# Patient Record
Sex: Female | Born: 1965
Health system: Southern US, Community
[De-identification: ages and names within clinical notes are randomized; demographics above are authoritative.]

## PROBLEM LIST (undated history)

## (undated) DIAGNOSIS — F329 Major depressive disorder, single episode, unspecified: Secondary | ICD-10-CM

## (undated) DIAGNOSIS — I10 Essential (primary) hypertension: Secondary | ICD-10-CM

## (undated) DIAGNOSIS — M069 Rheumatoid arthritis, unspecified: Secondary | ICD-10-CM

## (undated) DIAGNOSIS — K529 Noninfective gastroenteritis and colitis, unspecified: Secondary | ICD-10-CM

## (undated) DIAGNOSIS — M1990 Unspecified osteoarthritis, unspecified site: Secondary | ICD-10-CM

## (undated) DIAGNOSIS — F32A Depression, unspecified: Secondary | ICD-10-CM

## (undated) DIAGNOSIS — D649 Anemia, unspecified: Secondary | ICD-10-CM

## (undated) DIAGNOSIS — N390 Urinary tract infection, site not specified: Secondary | ICD-10-CM

## (undated) DIAGNOSIS — K2970 Gastritis, unspecified, without bleeding: Secondary | ICD-10-CM

## (undated) DIAGNOSIS — D351 Benign neoplasm of parathyroid gland: Secondary | ICD-10-CM

## (undated) DIAGNOSIS — N189 Chronic kidney disease, unspecified: Secondary | ICD-10-CM

## (undated) HISTORY — DX: Urinary tract infection, site not specified: N39.0

## (undated) HISTORY — DX: Major depressive disorder, single episode, unspecified: F32.9

## (undated) HISTORY — DX: Unspecified osteoarthritis, unspecified site: M19.90

## (undated) HISTORY — DX: Gastritis, unspecified, without bleeding: K29.70

## (undated) HISTORY — DX: Noninfective gastroenteritis and colitis, unspecified: K52.9

## (undated) HISTORY — PX: FOOT SURGERY: SHX648

## (undated) HISTORY — DX: Depression, unspecified: F32.A

## (undated) HISTORY — DX: Essential (primary) hypertension: I10

---
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
## 1998-02-22 ENCOUNTER — Other Ambulatory Visit: Admission: RE | Admit: 1998-02-22 | Discharge: 1998-02-22 | Payer: Self-pay | Admitting: *Deleted

## 1998-07-18 ENCOUNTER — Other Ambulatory Visit: Admission: RE | Admit: 1998-07-18 | Discharge: 1998-07-18 | Payer: Self-pay | Admitting: *Deleted

## 2000-01-16 ENCOUNTER — Other Ambulatory Visit: Admission: RE | Admit: 2000-01-16 | Discharge: 2000-01-16 | Payer: Self-pay | Admitting: *Deleted

## 2001-02-05 ENCOUNTER — Other Ambulatory Visit: Admission: RE | Admit: 2001-02-05 | Discharge: 2001-02-05 | Payer: Self-pay | Admitting: *Deleted

## 2002-02-17 ENCOUNTER — Other Ambulatory Visit: Admission: RE | Admit: 2002-02-17 | Discharge: 2002-02-17 | Payer: Self-pay | Admitting: *Deleted

## 2003-02-21 ENCOUNTER — Other Ambulatory Visit: Admission: RE | Admit: 2003-02-21 | Discharge: 2003-02-21 | Payer: Self-pay | Admitting: *Deleted

## 2006-10-07 ENCOUNTER — Ambulatory Visit: Payer: Self-pay | Admitting: *Deleted

## 2007-01-15 ENCOUNTER — Ambulatory Visit: Payer: Self-pay | Admitting: Internal Medicine

## 2007-01-16 ENCOUNTER — Ambulatory Visit: Payer: Self-pay | Admitting: Internal Medicine

## 2008-03-21 ENCOUNTER — Ambulatory Visit: Payer: Self-pay | Admitting: *Deleted

## 2009-04-03 ENCOUNTER — Ambulatory Visit: Payer: Self-pay

## 2009-04-10 ENCOUNTER — Ambulatory Visit: Payer: Self-pay

## 2010-06-19 ENCOUNTER — Ambulatory Visit: Payer: Self-pay | Admitting: Obstetrics

## 2011-01-09 ENCOUNTER — Ambulatory Visit: Payer: Self-pay | Admitting: Family Medicine

## 2011-06-16 ENCOUNTER — Ambulatory Visit: Payer: Self-pay | Admitting: Gastroenterology

## 2011-08-05 ENCOUNTER — Ambulatory Visit: Payer: Self-pay | Admitting: Obstetrics

## 2011-08-07 LAB — HM MAMMOGRAPHY

## 2012-01-09 ENCOUNTER — Other Ambulatory Visit: Payer: Self-pay | Admitting: Family Medicine

## 2012-09-06 LAB — HM PAP SMEAR: HM Pap smear: NORMAL

## 2012-10-26 ENCOUNTER — Ambulatory Visit: Payer: Self-pay | Admitting: Obstetrics

## 2012-12-23 ENCOUNTER — Encounter: Payer: Self-pay | Admitting: Internal Medicine

## 2012-12-23 ENCOUNTER — Ambulatory Visit (INDEPENDENT_AMBULATORY_CARE_PROVIDER_SITE_OTHER): Payer: BC Managed Care – PPO | Admitting: Internal Medicine

## 2012-12-23 VITALS — BP 122/78 | HR 86 | Temp 98.1°F | Resp 16 | Ht 61.5 in | Wt 149.5 lb

## 2012-12-23 DIAGNOSIS — R5383 Other fatigue: Secondary | ICD-10-CM

## 2012-12-23 DIAGNOSIS — M069 Rheumatoid arthritis, unspecified: Secondary | ICD-10-CM

## 2012-12-23 DIAGNOSIS — F329 Major depressive disorder, single episode, unspecified: Secondary | ICD-10-CM

## 2012-12-23 DIAGNOSIS — Z23 Encounter for immunization: Secondary | ICD-10-CM

## 2012-12-23 DIAGNOSIS — N921 Excessive and frequent menstruation with irregular cycle: Secondary | ICD-10-CM

## 2012-12-23 DIAGNOSIS — F32A Depression, unspecified: Secondary | ICD-10-CM

## 2012-12-23 DIAGNOSIS — Z13220 Encounter for screening for lipoid disorders: Secondary | ICD-10-CM

## 2012-12-23 DIAGNOSIS — R5381 Other malaise: Secondary | ICD-10-CM

## 2012-12-23 DIAGNOSIS — N920 Excessive and frequent menstruation with regular cycle: Secondary | ICD-10-CM

## 2012-12-23 LAB — TSH: TSH: 1.4 u[IU]/mL (ref 0.35–5.50)

## 2012-12-23 LAB — CBC WITH DIFFERENTIAL/PLATELET
Basophils Absolute: 0 10*3/uL (ref 0.0–0.1)
Eosinophils Relative: 2.5 % (ref 0.0–5.0)
Hemoglobin: 14.4 g/dL (ref 12.0–15.0)
Lymphocytes Relative: 21.3 % (ref 12.0–46.0)
Monocytes Relative: 6 % (ref 3.0–12.0)
Neutro Abs: 6.4 10*3/uL (ref 1.4–7.7)
Platelets: 342 10*3/uL (ref 150.0–400.0)
RDW: 13.8 % (ref 11.5–14.6)
WBC: 9.1 10*3/uL (ref 4.5–10.5)

## 2012-12-23 LAB — COMPREHENSIVE METABOLIC PANEL
ALT: 30 U/L (ref 0–35)
CO2: 25 mEq/L (ref 19–32)
Calcium: 9.9 mg/dL (ref 8.4–10.5)
Chloride: 104 mEq/L (ref 96–112)
Creatinine, Ser: 0.7 mg/dL (ref 0.4–1.2)
GFR: 92.34 mL/min (ref >60.00)
Glucose, Bld: 93 mg/dL (ref 70–99)

## 2012-12-23 LAB — LIPID PANEL
Cholesterol: 191 mg/dL (ref 0–200)
HDL: 58.9 mg/dL (ref >39.00)
Total CHOL/HDL Ratio: 3
Triglycerides: 38 mg/dL (ref 0.0–149.0)

## 2012-12-23 NOTE — Progress Notes (Signed)
Patient ID: Sherry Maxwell, female   DOB: 05-08-1966, 47 y.o.   MRN: [id number]  Patient Active Problem List  Diagnosis  . Rheumatoid arthritis  . Menometrorrhagia  . Depression    Subjective:  CC:   Chief Complaint  Patient presents with  . Establish Care    HPI:   Sherry Maxwell is a 47 y.o. female who presents as a new patient to establish primary care with several concerns:   1)Menometroraghia.   Her heavy bleeding has been accompanied by menopausal symptoms but odl shrecent serologic evaluation of hormone levels by her gynecologist indicated no evidence of perimenopausal status. Her periods have been irregular and she alternates between spotting followed by no periods for several months followed by bleeding at up to 17 days in a room with heavy flow noted. She has had a urine pregnancy test which was negative. She was told to take ibuprofen which did stop the bleeding. She has been started on Loestrin but stopped it after one month when her bleeding continued. She is wary of taking hormone therapy since her family history is positive for breast cancer  in her mother. She has opted to try managing her menopausal syndrome with natural remedies.   2) Moodiness.   Her symptoms are aggravated by increased responsibilities at home sharing the burden of care for her father-in-law who lives with them and is battling advanced lung cancer complicated by COPD and coronary artery disease.  this is her  2nd marriage, and she has been married to her husband for 7 years .  2 children from first marriage. Daughter is 77 and getting married in May.  Very happy about that.   Was a single parent for 14 years.  Son is a Arts administrator at [employer] and wants to go to PT school.   both her father and her father-in-law are patients of mine. Her father is Mickie Bail and her father-in-law is  Azelea Seguin.  She has very good self-awareness skills and recognizes that part of her irritabilityis due to resentment  and jealousy  over the time her husband spends with her father-in-law. She is managing her symptoms with massage therapy and psychotherapy.  She discontinued  zoloft 8 months ago, but continues to  have occasional panic attacks.,  Uses prn alprazolam.  All in all she feels much better than 3 months ago.  3) Weight gain.  In the past 7 year since remarrying (to [place])), she gained  45 lbs.  However she has thus far lost 16 lbs in the past 2 months and has been using Herbal Life for the past month .   does not exercise regularly. 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 Reviewed her schedule. ;  Gets up at 7 am   Works at [employer] in [place]  Arrives at [employer].  manages a Fish farm manager .  Takes a 1 hr lunch break. Off at 5:30  ,  Home by 6:15.  Prepares dinner,  Does not take work home.  In bed by 11:00 .     Past Medical History  Diagnosis Date  . Urinary tract infection   . rheumatoid arthritis   . Hypertension   . Depression     Past Surgical History  Procedure Laterality Date  . 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 Foot surgery      1996 and 2011    Family History  Problem Relation Age of Onset  . Arthritis Mother   .  Cancer Mother     breast  . Hypertension Mother   . Thyroid disease Mother     Graves Disease  . Hypertension Father   . Diabetes Father     pancreatitis induced  . Thyroid disease Sister     Graves Disease  . Thyroid disease Brother     Graves Disease  . Hypertension Maternal Grandmother   . Arthritis Paternal Grandmother     History   Social History  . Marital Status: Married    Spouse Name: N/A    Number of Children: N/A  . Years of Education: N/A   Occupational History  . Not on file.   Social History Main Topics  . Smoking status: Never Smoker   . Smokeless tobacco: Never Used  . Alcohol Use: Yes  . Drug Use: No  . Sexually Active: Yes   Other Topics Concern  . Not on file   Social History Narrative  . No narrative on file   Allergies   Allergen Reactions  . Sulfa Antibiotics Swelling and Rash     Review of Systems:   The remainder of the review of systems was negative except those addressed in the HPI.   Objective:  BP 122/78  Pulse 86  Temp(Src) 98.1 F (36.7 C) (Oral)  Resp 16  Ht 5' 1.5" (1.562 m)  Wt 149 lb 8 oz (67.813 kg)  BMI 27.79 kg/m2  SpO2 99%  LMP 12/11/2012  General appearance: alert, cooperative and appears stated age Ears: normal TM's and external ear canals both ears Throat: lips, mucosa, and tongue normal; teeth and gums normal Neck: no adenopathy, no carotid bruit, supple, symmetrical, trachea midline and thyroid not enlarged, symmetric, no tenderness/mass/nodules Back: symmetric, no curvature. ROM normal. No CVA tenderness. Lungs: clear to auscultation bilaterally Heart: regular rate and rhythm, S1, S2 normal, no murmur, click, rub or gallop Abdomen: soft, non-tender; bowel sounds normal; no masses,  no organomegaly Pulses: 2+ and symmetric Skin: Skin color, texture, turgor normal. No rashes or lesions Lymph nodes: Cervical, supraclavicular, and axillary nodes normal.  Assessment and Plan:  Rheumatoid arthritis Diagnosed by Dr. Jodie Echevaria at age 89.  She has no joint deformities.. She has been prescribed Enbrel but has been using it intermittently and her symptoms are currently quiescent. She has not had a flu shot this year or have and the Pneumovax vaccine. Both of these were given to her today.  Depression Managed nonpharmacologically currently with counselling , per patient preference.  With concurrent anxiety,  Recommended regular exercise  To relieve stress and manage her unwelcome  weight gain.   Menometrorrhagia She is under treatment currently by her gynecology .  No ultrasound has been done.   She has no history of epistaxis or prior history of bleeding diathesis.  CBC was normal today .  Will defer to GYN but given her desire to avoid HRT, may need to consider surgical  interventions including D&D and hysterectomy at next visit .   PCOS unlikely despite weight gain since her screening labs do not suggest metabolic syndrome    Updated Medication List Outpatient Encounter Prescriptions as of 12/23/2012  Medication Sig Dispense Refill  . ENBREL SURECLICK 50 MG/ML injection Pt takes the 50mg /mL once a week, by injection       No facility-administered encounter medications on file as of 12/23/2012.

## 2012-12-23 NOTE — Patient Instructions (Addendum)
We will call you or e mail you your results.  I encourage you to find a "sacred" time to exercise a few days per wee

## 2012-12-24 ENCOUNTER — Encounter: Payer: Self-pay | Admitting: Internal Medicine

## 2012-12-24 DIAGNOSIS — I10 Essential (primary) hypertension: Secondary | ICD-10-CM | POA: Insufficient documentation

## 2012-12-24 DIAGNOSIS — F329 Major depressive disorder, single episode, unspecified: Secondary | ICD-10-CM | POA: Insufficient documentation

## 2012-12-24 DIAGNOSIS — N921 Excessive and frequent menstruation with irregular cycle: Secondary | ICD-10-CM | POA: Insufficient documentation

## 2012-12-24 DIAGNOSIS — F32A Depression, unspecified: Secondary | ICD-10-CM | POA: Insufficient documentation

## 2012-12-24 NOTE — Assessment & Plan Note (Addendum)
She is under treatment currently by her gynecology .  No ultrasound has been done.   She has no history of epistaxis or prior history of bleeding diathesis.  CBC was normal today .  Will defer to GYN but given her desire to avoid HRT, may need to consider surgical interventions including D&D and hysterectomy at next visit .   PCOS unlikely despite weight gain since her screening labs do not suggest metabolic syndrome

## 2012-12-24 NOTE — Assessment & Plan Note (Addendum)
Managed nonpharmacologically currently with counselling , per patient preference.  With concurrent anxiety,  Recommended regular exercise  To relieve stress and manage her unwelcome  weight gain.

## 2012-12-24 NOTE — Assessment & Plan Note (Signed)
Diagnosed by Dr. Jodie Echevaria at age 47.  She has no joint deformities.. She has been prescribed Enbrel but has been using it intermittently and her symptoms are currently quiescent. She has not had a flu shot this year or have and the Pneumovax vaccine. Both of these were given to her today.

## 2013-08-03 ENCOUNTER — Encounter: Payer: Self-pay | Admitting: Internal Medicine

## 2013-08-03 ENCOUNTER — Telehealth: Payer: Self-pay | Admitting: Internal Medicine

## 2013-08-03 ENCOUNTER — Ambulatory Visit (INDEPENDENT_AMBULATORY_CARE_PROVIDER_SITE_OTHER): Payer: BC Managed Care – PPO | Admitting: Internal Medicine

## 2013-08-03 VITALS — BP 126/70 | HR 78 | Temp 98.0°F | Resp 12 | Wt 142.0 lb

## 2013-08-03 DIAGNOSIS — J329 Chronic sinusitis, unspecified: Secondary | ICD-10-CM

## 2013-08-03 DIAGNOSIS — J019 Acute sinusitis, unspecified: Secondary | ICD-10-CM | POA: Insufficient documentation

## 2013-08-03 DIAGNOSIS — F4321 Adjustment disorder with depressed mood: Secondary | ICD-10-CM

## 2013-08-03 DIAGNOSIS — Z8742 Personal history of other diseases of the female genital tract: Secondary | ICD-10-CM

## 2013-08-03 DIAGNOSIS — Z87898 Personal history of other specified conditions: Secondary | ICD-10-CM

## 2013-08-03 DIAGNOSIS — M069 Rheumatoid arthritis, unspecified: Secondary | ICD-10-CM

## 2013-08-03 MED ORDER — LEVOFLOXACIN 500 MG PO TABS: 500.0000 mg | ORAL_TABLET | Freq: Every day | ORAL | Status: DC

## 2013-08-03 NOTE — Patient Instructions (Addendum)
You have a sinus/ear infection   .  I am prescribing an antibiotic (levaquin  To manage the infection and the inflammation in your ear/sinuses.   I also advise use of the following OTC meds to help with your other symptoms.   Take generic OTC benadryl 25 mg every 8 hours for the drainage,  Sudafed PE  10 to 30 mg every 8 hours for the congestion, you may substitute Afrin nasal spray for the nighttime dose of sudafed PE  If needed to prevent insomnia.  flushes your sinuses twice daily with Simply Saline (do over the sink because if you do it right you will spit out globs of mucus)  Use the tussionex    FOR THE COUGH.  Gargle with salt water as needed for sore throat.   Plesae take the Align daily and for a week after you finish the levaquin to prevent the diarrhea we discussed

## 2013-08-03 NOTE — Telephone Encounter (Signed)
I can see her at 4:15.

## 2013-08-03 NOTE — Telephone Encounter (Signed)
Pt scheduled 4:15.  Pt aware.

## 2013-08-03 NOTE — Progress Notes (Signed)
Patient ID: Sherry Maxwell, female   DOB: 10/16/1966, 47 y.o.   MRN: [id number]  Patient Active Problem List   Diagnosis Date Noted  . History of abnormal Pap smear 08/06/2013  . Grief reaction 08/06/2013  . Acute sinusitis, unspecified 08/03/2013  . Menometrorrhagia 12/24/2012  . Depression   . Rheumatoid arthritis(714.0) 12/23/2012    Subjective:  CC:   Chief Complaint  Patient presents with  . Sinusitis    x 2 weeks    HPI:   Sherry Reidis a 47 y.o. female who present2 week history of sinus discharge and congestion accompanued by facial pain and fevers.  Symptoms failed to improve with supportive care using decongestants, mucinex, Flonase  and saline lavage.  Her drainage has now become purulent, and she endorses pharyngitis due to excessive PND.  Took one day of the amoxicillin but vomited. Felt like it started to help her symptoms but she developed nausea and vomiting and had to stp it after 24 hours .  The amoxicillin was left over from previous treatment for laryngitis with amoxicillin a month ago by Urgent care.   Her father in law Sherry Maxwell finally succombed to metastatic lung CA several weeks ago.   She and her husband had been caring for him 24/7 for the past 8 months.  Despite his prognosis, they were not anticipating his death and have been handling the event with a considerable amount of grieving and sense of loss.   Spent 15 minutes discussing her grief and processing.     Past Medical History  Diagnosis Date  . Urinary tract infection   . rheumatoid arthritis   . Hypertension   . Depression     Past Surgical History  Procedure Laterality Date  . 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 Foot surgery      1996 and 2011       The following portions of the patient's history were reviewed and updated as appropriate: Allergies, current medications, and problem list.    Review of Systems:   Patient denies  unintentional weight loss, skin rash, eye  pain, , dysphagia,  hemoptysis , dyspnea, wheezing, chest pain, palpitations, orthopnea, edema, abdominal pain, nausea, melena, diarrhea, constipation, flank pain, dysuria, hematuria, urinary  Frequency, nocturia, numbness, tingling, seizures,  Focal weakness, Loss of consciousness,  Tremor, insomnia, depression, anxiety, and suicidal ideation.         History   Social History  . Marital Status: Married    Spouse Name: N/A    Number of Children: N/A  . Years of Education: N/A   Occupational History  . Not on file.   Social History Main Topics  . Smoking status: Never Smoker   . Smokeless tobacco: Never Used  . Alcohol Use: Yes  . Drug Use: No  . Sexual Activity: Yes   Other Topics Concern  . Not on file   Social History Narrative  . No narrative on file    Objective:  Filed Vitals:   08/03/13 1629  BP: 126/70  Pulse: 78  Temp: 98 F (36.7 C)  Resp: 12     General appearance: alert, cooperative and appears stated age Ears: normal TM's and external ear canals both ears Throat: lips, mucosa, and tongue normal; teeth and gums normal Neck: no adenopathy, no carotid bruit, supple, symmetrical, trachea midline and thyroid not enlarged, symmetric, no tenderness/mass/nodules Back: symmetric, no curvature. ROM normal. No CVA tenderness. Lungs: clear to auscultation bilaterally Heart:  regular rate and rhythm, S1, S2 normal, no murmur, click, rub or gallop Abdomen: soft, non-tender; bowel sounds normal; no masses,  no organomegaly Pulses: 2+ and symmetric Skin: Skin color, texture, turgor normal. No rashes or lesions Lymph nodes: Cervical, supraclavicular, and axillary nodes normal.  Assessment and Plan:  History of abnormal Pap smear PAP was normal HPV negative,  By Ma Hillock [facility] GYN Nov 2013  Acute sinusitis, unspecified Her symptoms are likely failed to resolve due to her chronic immunosuppression for rheumatoid arthritis with Enbrel given that she had a partial  treated with amoxicillin the risk of  penicillin resistance is greater . Discussed using Levaquin decongestants and saline lavage.  Rheumatoid arthritis(714.0) Managed with Enbrel. Seeing Dr. Gavin Potters for rheumatology. Influenza and pneumonia vaccines were given in March.  Grief reaction Spent 15 minutes discussing her recent loss of father in law.counseling given.    Updated Medication List Outpatient Encounter Prescriptions as of 08/03/2013  Medication Sig Dispense Refill  . dextromethorphan-guaiFENesin (MUCINEX DM) 30-600 MG per 12 hr tablet Take 1 tablet by mouth every 12 (twelve) hours.      Elgie Collard SURECLICK 50 MG/ML injection Pt takes the 50mg /mL once a week, by injection      . fluticasone (FLONASE) 50 MCG/ACT nasal spray Place 2 sprays into the nose daily.      Marland Kitchen levofloxacin (LEVAQUIN) 500 MG tablet Take 1 tablet (500 mg total) by mouth daily.  10 tablet  0   No facility-administered encounter medications on file as of 08/03/2013.     Orders Placed This Encounter  Procedures  . HM MAMMOGRAPHY  . HM PAP SMEAR    No Follow-up on file.

## 2013-08-03 NOTE — Telephone Encounter (Signed)
Has sinus problems, blowing green, sore throat, feels like throat closing up at night, drainage making her sick on stomach.  No appt available.  Can she be seen today?  Call work [phone], ask for [place].  Only take about 10 minutes to get here.

## 2013-08-06 ENCOUNTER — Encounter: Payer: Self-pay | Admitting: Internal Medicine

## 2013-08-06 DIAGNOSIS — Z87898 Personal history of other specified conditions: Secondary | ICD-10-CM | POA: Insufficient documentation

## 2013-08-06 DIAGNOSIS — F4321 Adjustment disorder with depressed mood: Secondary | ICD-10-CM | POA: Insufficient documentation

## 2013-08-06 NOTE — Assessment & Plan Note (Signed)
PAP was normal HPV negative,  By Ma Hillock [facility] GYN Nov 2013

## 2013-08-06 NOTE — Assessment & Plan Note (Signed)
Her symptoms are likely failed to resolve due to her chronic immunosuppression for rheumatoid arthritis with Enbrel given that she had a partial treated with amoxicillin the risk of  penicillin resistance is greater . Discussed using Levaquin decongestants and saline lavage.

## 2013-08-06 NOTE — Assessment & Plan Note (Signed)
Spent 15 minutes discussing her recent loss of father in Social worker.counseling given.

## 2013-08-06 NOTE — Assessment & Plan Note (Addendum)
Managed with Enbrel. Seeing Dr. Gavin Potters for rheumatology. Influenza and pneumonia vaccines were given in March.

## 2013-08-16 ENCOUNTER — Encounter: Payer: Self-pay | Admitting: Internal Medicine

## 2013-08-16 DIAGNOSIS — N951 Menopausal and female climacteric states: Secondary | ICD-10-CM

## 2013-08-17 DIAGNOSIS — Z780 Asymptomatic menopausal state: Secondary | ICD-10-CM | POA: Insufficient documentation

## 2013-08-17 MED ORDER — SERTRALINE HCL 50 MG PO TABS: 50.0000 mg | ORAL_TABLET | Freq: Every day | ORAL | Status: DC

## 2013-08-24 ENCOUNTER — Encounter: Payer: Self-pay | Admitting: Internal Medicine

## 2013-08-25 ENCOUNTER — Other Ambulatory Visit: Payer: Self-pay

## 2014-03-08 ENCOUNTER — Telehealth: Payer: Self-pay | Admitting: Internal Medicine

## 2014-03-08 NOTE — Telephone Encounter (Signed)
Patient Information:  Caller Name: [place]  Phone: [phone]  Patient: Sherry Maxwell  Gender: Female  DOB: [birth year]  Age: 48 Years  PCP: Deborra Medina (Adults only)  Pregnant: No  Office Follow Up:  Does the office need to follow up with this patient?: No  Instructions For The Office: N/A   Symptoms  Reason For Call & Symptoms: Calling about nausea on and off onset 03/02/14 and mild diarrhea onset 03/02/14 in the evening. Starting feeling tired on 03/05/14 and diarrhea increased on 03/05/14 and she is getting pain in upper stomach after eating. Drinking plenty of clear fluids. Voiding QS. She has slight sore throat. Concerned that diarrhea has been going on almost a week- requesting appointment.  Reviewed Health History In EMR: Yes  Reviewed Medications In EMR: Yes  Reviewed Allergies In EMR: Yes  Reviewed Surgeries / Procedures: Yes  Date of Onset of Symptoms: 03/03/2014 OB / GYN:  LMP: Unknown  Guideline(s) Used:  Diarrhea  Disposition Per Guideline:   See Within 3 Days in Office  Reason For Disposition Reached:   Patient wants to be seen  Advice Given:  Fluids:  Drink more fluids, at least 8-10 glasses (8 oz or 240 ml) daily.  For example: sports drinks, diluted fruit juices, soft drinks.  Supplement this with saltine crackers or soups to make certain that you are getting sufficient fluid and salt to meet your body's needs.  Expected Course:  Viral diarrhea lasts 4-7 days. Always worse on days 1 and 2.  Call Back If:  Signs of dehydration occur (e.g., no urine for more than 12 hours, very dry mouth, lightheaded, etc.)  Diarrhea lasts over 7 days  You become worse.  Patient Will Follow Care Advice:  YES  Appointment Scheduled:  03/10/2014 13:30:00 Appointment Scheduled Provider:  Deborra Medina (Adults only)

## 2014-03-10 ENCOUNTER — Ambulatory Visit (INDEPENDENT_AMBULATORY_CARE_PROVIDER_SITE_OTHER): Payer: BC Managed Care – PPO | Admitting: Internal Medicine

## 2014-03-10 ENCOUNTER — Encounter: Payer: Self-pay | Admitting: Internal Medicine

## 2014-03-10 VITALS — BP 126/80 | HR 80 | Temp 98.6°F | Resp 16 | Ht 61.5 in | Wt 149.8 lb

## 2014-03-10 DIAGNOSIS — K2970 Gastritis, unspecified, without bleeding: Secondary | ICD-10-CM

## 2014-03-10 DIAGNOSIS — R1011 Right upper quadrant pain: Secondary | ICD-10-CM

## 2014-03-10 DIAGNOSIS — K2990 Gastroduodenitis, unspecified, without bleeding: Secondary | ICD-10-CM

## 2014-03-10 DIAGNOSIS — R110 Nausea: Secondary | ICD-10-CM

## 2014-03-10 LAB — POCT URINALYSIS DIPSTICK
Bilirubin, UA: NEGATIVE
Glucose, UA: NEGATIVE
Ketones, UA: NEGATIVE
NITRITE UA: NEGATIVE
Protein, UA: NEGATIVE
Spec Grav, UA: 1.01
Urobilinogen, UA: 0.2
pH, UA: 6.5

## 2014-03-10 LAB — COMPREHENSIVE METABOLIC PANEL
ALBUMIN: 4.1 g/dL (ref 3.5–5.2)
ALK PHOS: 88 U/L (ref 39–117)
ALT: 47 U/L — ABNORMAL HIGH (ref 0–35)
AST: 31 U/L (ref 0–37)
BILIRUBIN TOTAL: 0.8 mg/dL (ref 0.2–1.2)
BUN: 15 mg/dL (ref 6–23)
CO2: 30 mEq/L (ref 19–32)
CREATININE: 0.7 mg/dL (ref 0.4–1.2)
Calcium: 10.3 mg/dL (ref 8.4–10.5)
Chloride: 101 mEq/L (ref 96–112)
GFR: 93.35 mL/min (ref >60.00)
GLUCOSE: 89 mg/dL (ref 70–99)
POTASSIUM: 5 meq/L (ref 3.5–5.1)
Sodium: 138 mEq/L (ref 135–145)
Total Protein: 8.1 g/dL (ref 6.0–8.3)

## 2014-03-10 LAB — CBC WITH DIFFERENTIAL/PLATELET
BASOS PCT: 0.3 % (ref 0.0–3.0)
Basophils Absolute: 0 10*3/uL (ref 0.0–0.1)
Eosinophils Absolute: 0.7 10*3/uL (ref 0.0–0.7)
Eosinophils Relative: 7.2 % — ABNORMAL HIGH (ref 0.0–5.0)
HCT: 42.8 % (ref 36.0–46.0)
Hemoglobin: 14.3 g/dL (ref 12.0–15.0)
LYMPHS PCT: 35.1 % (ref 12.0–46.0)
Lymphs Abs: 3.2 10*3/uL (ref 0.7–4.0)
MCHC: 33.4 g/dL (ref 30.0–36.0)
MCV: 88.4 fl (ref 78.0–100.0)
MONO ABS: 0.5 10*3/uL (ref 0.1–1.0)
Monocytes Relative: 5.8 % (ref 3.0–12.0)
NEUTROS PCT: 51.6 % (ref 43.0–77.0)
Neutro Abs: 4.7 10*3/uL (ref 1.4–7.7)
Platelets: 322 10*3/uL (ref 150.0–400.0)
RBC: 4.84 Mil/uL (ref 3.87–5.11)
RDW: 13.3 % (ref 11.5–15.5)
WBC: 9.2 10*3/uL (ref 4.0–10.5)

## 2014-03-10 LAB — POCT URINE PREGNANCY: Preg Test, Ur: NEGATIVE

## 2014-03-10 LAB — H. PYLORI ANTIBODY, IGG: H Pylori IgG: NEGATIVE

## 2014-03-10 LAB — C-REACTIVE PROTEIN

## 2014-03-10 MED ORDER — ONDANSETRON HCL 4 MG PO TABS: 4.0000 mg | ORAL_TABLET | Freq: Three times a day (TID) | ORAL | Status: DC | PRN

## 2014-03-10 MED ORDER — OMEPRAZOLE 40 MG PO CPDR: 40.0000 mg | DELAYED_RELEASE_CAPSULE | Freq: Every day | ORAL | Status: DC

## 2014-03-10 NOTE — Progress Notes (Signed)
Pre visit review using our clinic review tool, if applicable. No additional management support is needed unless otherwise documented below in the visit note. 

## 2014-03-10 NOTE — Patient Instructions (Addendum)
I am treating you for  Gastritis  With omeprazole 40 mg daily.  but would like to obtain an ultrasound of your liver next week  You can use the zofran every 6 hours as needed for nausea  Take the omeprazole daily in the morning about 20 minutes prior to food   Biliary Colic  Biliary colic is a steady or irregular pain in the upper abdomen. It is usually under the right side of the rib cage. It happens when gallstones interfere with the normal flow of bile from the gallbladder. Bile is a liquid that helps to digest fats. Bile is made in the liver and stored in the gallbladder. When you eat a meal, bile passes from the gallbladder through the cystic duct and the common bile duct into the small intestine. There, it mixes with partially digested food. If a gallstone blocks either of these ducts, the normal flow of bile is blocked. The muscle cells in the bile duct contract forcefully to try to move the stone. This causes the pain of biliary colic.  SYMPTOMS   A person with biliary colic usually complains of pain in the upper abdomen. This pain can be:  In the center of the upper abdomen just below the breastbone.  In the upper-right part of the abdomen, near the gallbladder and liver.  Spread back toward the right shoulder blade.  Nausea and vomiting.  The pain usually occurs after eating.  Biliary colic is usually triggered by the digestive system's demand for bile. The demand for bile is high after fatty meals. Symptoms can also occur when a person who has been fasting suddenly eats a very large meal. Most episodes of biliary colic pass after 1 to 5 hours. After the most intense pain passes, your abdomen may continue to ache mildly for about 24 hours. DIAGNOSIS  After you describe your symptoms, your caregiver will perform a physical exam. He or she will pay attention to the upper right portion of your belly (abdomen). This is the area of your liver and gallbladder. An ultrasound will help your  caregiver look for gallstones. Specialized scans of the gallbladder may also be done. Blood tests may be done, especially if you have fever or if your pain persists. PREVENTION  Biliary colic can be prevented by controlling the risk factors for gallstones. Some of these risk factors, such as heredity, increasing age, and pregnancy are a normal part of life. Obesity and a high-fat diet are risk factors you can change through a healthy lifestyle. Women going through menopause who take hormone replacement therapy (estrogen) are also more likely to develop biliary colic. TREATMENT   Pain medication may be prescribed.  You may be encouraged to eat a fat-free diet.  If the first episode of biliary colic is severe, or episodes of colic keep retuning, surgery to remove the gallbladder (cholecystectomy) is usually recommended. This procedure can be done through small incisions using an instrument called a laparoscope. The procedure often requires a brief stay in the hospital. Some people can leave the hospital the same day. It is the most widely used treatment in people troubled by painful gallstones. It is effective and safe, with no complications in more than 90% of cases.  If surgery cannot be done, medication that dissolves gallstones may be used. This medication is expensive and can take months or years to work. Only small stones will dissolve.  Rarely, medication to dissolve gallstones is combined with a procedure called shock-wave lithotripsy. This procedure  uses carefully aimed shock waves to break up gallstones. In many people treated with this procedure, gallstones form again within a few years. PROGNOSIS  If gallstones block your cystic duct or common bile duct, you are at risk for repeated episodes of biliary colic. There is also a 25% chance that you will develop a gallbladder infection(acute cholecystitis), or some other complication of gallstones within 10 to 20 years. If you have surgery,  schedule it at a time that is convenient for you and at a time when you are not sick. HOME CARE INSTRUCTIONS   Drink plenty of clear fluids.  Avoid fatty, greasy or fried foods, or any foods that make your pain worse.  Take medications as directed. SEEK MEDICAL CARE IF:   You develop a fever over 100.5 F (38.1 C).  Your pain gets worse over time.  You develop nausea that prevents you from eating and drinking.  You develop vomiting. SEEK IMMEDIATE MEDICAL CARE IF:   You have continuous or severe belly (abdominal) pain which is not relieved with medications.  You develop nausea and vomiting which is not relieved with medications.  You have symptoms of biliary colic and you suddenly develop a fever and shaking chills. This may signal cholecystitis. Call your caregiver immediately.  You develop a yellow color to your skin or the white part of your eyes (jaundice). Document Released: 03/09/2006 Document Revised: 12/29/2011 Document Reviewed: 05/18/2008 [facility] Patient Information 2014 [place].

## 2014-03-10 NOTE — Progress Notes (Signed)
Patient ID: Sherry Maxwell, female   DOB: [birth year], 48 y.o.   MRN: [id number]   Patient Active Problem List   Diagnosis Date Noted  . Unspecified gastritis and gastroduodenitis without mention of hemorrhage 03/10/2014  . Nausea alone 03/10/2014  . Perimenopause 08/17/2013  . History of abnormal Pap smear 08/06/2013  . Grief reaction 08/06/2013  . Menometrorrhagia 12/24/2012  . Depression   . Rheumatoid arthritis(714.0) 12/23/2012    Subjective:  CC:   Chief Complaint  Patient presents with  . Nausea    nausea started last friday, diarrhea saturday and sunday. Sunday night had upper abdominal pain. Symptoms off and on since then    HPI:   Sherry Maxwell is a 48 y.o. female who presents for Nausea started first  Friday a week ago,  In waves,  Still had appetite.  Saturday had fatigue,  Loose bowels,  Arms aching Sunday 5 loose stools continuously,  Still ate ok,  Nausea in waves. Monday went to work ok  Then Monday night upper epigastric pain.  Same thing Tuesday with more diarrhea.  Taking Enbrel for a long time   Starting using Align and stools have started to solidify  Perimenopausal,  Had a period after 11 months of amenorrhea recently.  Then nothing for the past  Months.     Past Medical History  Diagnosis Date  . Urinary tract infection   . rheumatoid arthritis   . Hypertension   . Depression     Past Surgical History  Procedure Laterality Date  . 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 Foot surgery      19 96 and 2011       The following portions of the patient's history were reviewed and updated as appropriate: Allergies, current medications, and problem list.    Review of Systems:   Patient denies headache, fevers, malaise, unintentional weight loss, skin rash, eye pain, sinus congestion and sinus pain, sore throat, dysphagia,  hemoptysis , cough, dyspnea, wheezing, chest pain, palpitations, orthopnea, edema, abdominal pain, nausea, melena,  diarrhea, constipation, flank pain, dysuria, hematuria, urinary  Frequency, nocturia, numbness, tingling, seizures,  Focal weakness, Loss of consciousness,  Tremor, insomnia, depression, anxiety, and suicidal ideation.     History   Social History  . Marital Status: Married    Spouse Name: N/A    Number of Children: N/A  . Years of Education: N/A   Occupational History  . Not on file.   Social History Main Topics  . Smoking status: Never Smoker   . Smokeless tobacco: Never Used  . Alcohol Use: Yes  . Drug Use: No  . Sexual Activity: Yes   Other Topics Concern  . Not on file   Social History Narrative  . No narrative on file    Objective:  Filed Vitals:   03/10/14 1334  BP: 126/80  Pulse: 80  Temp: 98.6 F (37 C)  Resp: 16     General appearance: alert, cooperative and appears stated age Ears: normal TM's and external ear canals both ears Throat: lips, mucosa, and tongue normal; teeth and gums normal Neck: no adenopathy, no carotid bruit, supple, symmetrical, trachea midline and thyroid not enlarged, symmetric, no tenderness/mass/nodules Back: symmetric, no curvature. ROM normal. No CVA tenderness. Lungs: clear to auscultation bilaterally Heart: regular rate and rhythm, S1, S2 normal, no murmur, click, rub or gallop Abdomen: soft, non-tender; bowel sounds normal; no masses,  no organomegaly Pulses: 2+ and symmetric Skin: Skin color,  texture, turgor normal. No rashes or lesions Lymph nodes: Cervical, supraclavicular, and axillary nodes normal.  Assessment and Plan:  Nausea alone U{T was negative/ Trial of PPI and checking ultrasound   Unspecified gastritis and gastroduodenitis without mention of hemorrhage No signs of GI bleed on today's FOBT.  Trial of PPI.    Updated Medication List Outpatient Encounter Prescriptions as of 03/10/2014  Medication Sig  . ENBREL SURECLICK 50 MG/ML injection Pt takes the 49m/mL once a week, by injection  . fluticasone  (FLONASE) 50 MCG/ACT nasal spray Place 2 sprays into the nose daily.  .Marland Kitchenomeprazole (PRILOSEC) 40 MG capsule Take 1 capsule (40 mg total) by mouth daily.  . ondansetron (ZOFRAN) 4 MG tablet Take 1 tablet (4 mg total) by mouth every 8 (eight) hours as needed for nausea or vomiting.  . [DISCONTINUED] dextromethorphan-guaiFENesin (MUCINEX DM) 30-600 MG per 12 hr tablet Take 1 tablet by mouth every 12 (twelve) hours.  . [DISCONTINUED] levofloxacin (LEVAQUIN) 500 MG tablet Take 1 tablet (500 mg total) by mouth daily.  . [DISCONTINUED] sertraline (ZOLOFT) 50 MG tablet Take 1 tablet (50 mg total) by mouth daily.     Orders Placed This Encounter  Procedures  . Fecal occult blood, imunochemical  . Urine culture  . [place]Abdomen Complete  . Comp Met (CMET)  . CBC with Differential  . H. pylori antibody, IgG  . C-reactive protein  . POCT urine pregnancy  . POCT UA - Glucose/Protein  . POCT urinalysis dipstick    No Follow-up on file.

## 2014-03-12 ENCOUNTER — Encounter: Payer: Self-pay | Admitting: Internal Medicine

## 2014-03-12 ENCOUNTER — Other Ambulatory Visit: Payer: Self-pay | Admitting: Internal Medicine

## 2014-03-12 DIAGNOSIS — R748 Abnormal levels of other serum enzymes: Secondary | ICD-10-CM

## 2014-03-12 LAB — URINE CULTURE

## 2014-03-12 NOTE — Assessment & Plan Note (Signed)
U{T was negative/ Trial of PPI and checking ultrasound

## 2014-03-12 NOTE — Assessment & Plan Note (Signed)
No signs of GI bleed on today's FOBT.  Trial of PPI.

## 2014-03-21 ENCOUNTER — Ambulatory Visit: Payer: Self-pay | Admitting: Internal Medicine

## 2014-03-22 ENCOUNTER — Telehealth: Payer: Self-pay | Admitting: Internal Medicine

## 2014-03-23 ENCOUNTER — Other Ambulatory Visit: Payer: BC Managed Care – PPO

## 2014-03-24 ENCOUNTER — Other Ambulatory Visit (INDEPENDENT_AMBULATORY_CARE_PROVIDER_SITE_OTHER): Payer: BC Managed Care – PPO

## 2014-03-24 ENCOUNTER — Other Ambulatory Visit: Payer: Self-pay | Admitting: Internal Medicine

## 2014-03-24 DIAGNOSIS — Z1211 Encounter for screening for malignant neoplasm of colon: Secondary | ICD-10-CM

## 2014-03-24 LAB — FECAL OCCULT BLOOD, IMMUNOCHEMICAL: Fecal Occult Bld: POSITIVE — AB

## 2014-03-24 NOTE — Progress Notes (Signed)
[facility] called for orders for IFOB orders submitted.

## 2014-03-26 ENCOUNTER — Encounter: Payer: Self-pay | Admitting: Internal Medicine

## 2014-03-27 ENCOUNTER — Telehealth: Payer: Self-pay | Admitting: *Deleted

## 2014-03-27 NOTE — Telephone Encounter (Signed)
Pt would like to know how urgent a GI referral would be, called Dr. Marton Redwood office, first appointment would be in August. She states she is not having abdominal pain anymore, pt is worried if she needs to see someone more urgently.

## 2014-03-28 NOTE — Telephone Encounter (Signed)
August is fine.  The referral is for a postive FOBT, but she is not anemic so not urgent

## 2014-03-28 NOTE — Telephone Encounter (Signed)
Sent mychart

## 2014-04-07 ENCOUNTER — Encounter: Payer: Self-pay | Admitting: Internal Medicine

## 2014-05-20 HISTORY — PX: COLONOSCOPY: SHX174

## 2014-05-20 HISTORY — PX: UPPER GI ENDOSCOPY: SHX6162

## 2014-05-23 DIAGNOSIS — I7300 Raynaud's syndrome without gangrene: Secondary | ICD-10-CM | POA: Insufficient documentation

## 2014-06-06 ENCOUNTER — Ambulatory Visit: Payer: Self-pay | Admitting: Gastroenterology

## 2014-06-09 LAB — PATHOLOGY REPORT

## 2014-06-13 ENCOUNTER — Encounter: Payer: Self-pay | Admitting: *Deleted

## 2014-07-18 ENCOUNTER — Ambulatory Visit: Payer: Self-pay | Admitting: Gastroenterology

## 2014-07-18 LAB — HCG, QUANTITATIVE, PREGNANCY: Beta Hcg, Quant.: 8 m[IU]/mL — ABNORMAL HIGH

## 2014-07-20 ENCOUNTER — Encounter: Payer: Self-pay | Admitting: *Deleted

## 2014-08-08 ENCOUNTER — Ambulatory Visit (INDEPENDENT_AMBULATORY_CARE_PROVIDER_SITE_OTHER): Payer: BC Managed Care – PPO | Admitting: General Surgery

## 2014-08-08 ENCOUNTER — Encounter: Payer: Self-pay | Admitting: General Surgery

## 2014-08-08 VITALS — BP 138/78 | HR 78 | Resp 12 | Ht 61.0 in | Wt 151.0 lb

## 2014-08-08 DIAGNOSIS — K52832 Lymphocytic colitis: Secondary | ICD-10-CM

## 2014-08-08 DIAGNOSIS — K5289 Other specified noninfective gastroenteritis and colitis: Secondary | ICD-10-CM

## 2014-08-08 DIAGNOSIS — R112 Nausea with vomiting, unspecified: Secondary | ICD-10-CM

## 2014-08-08 DIAGNOSIS — K2980 Duodenitis without bleeding: Secondary | ICD-10-CM

## 2014-08-08 NOTE — Progress Notes (Signed)
Patient ID: Sherry Maxwell, female   DOB: Mar 25, 1966, 48 y.o.   MRN: [id number]  Chief Complaint  Patient presents with  . Other    abdominal pain    HPI Sherry Maxwell is a 48 y.o. female who presents for an evaluation of abdominal pain. She states she started having pain and diarrhea back in May 2015. She also complains of nausea and decreased appetite. She denies any constipation or vomiting. She states she feels the best when she doesn't eat. She states she has trouble with spicy and greasy foods as well as salads. She has had a colonoscopy and endoscopy done in August 2015. She also had an abdominal ultrasound done in June 2015 and September 2015. Diarrhea is 3-4 times a day. The patient has been tested for celiac disease to check for any allergies. She has a sensation of bloating.   The patient reports that prior to her HIDA scan a urine pregnancy test was equivocal. She subsequently has had several data hCG determinations all of which have been elevated for a woman her age but not suggestive of pregnancy. She has seen her gynecologist, Dr. Valentino Saxon at went over her OB/GYN who has completed a pelvic ultrasound. By report the left ovary was not visualized but otherwise the ultrasound was normal.  HPI  Past Medical History  Diagnosis Date  . Urinary tract infection   . rheumatoid arthritis   . Hypertension   . Depression   . Gastritis   . Colitis     Past Surgical History  Procedure Laterality Date  . 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 Foot surgery      1996 and 2011  . Colonoscopy  05/2014  . Upper gi endoscopy  05/2014    Family History  Problem Relation Age of Onset  . Arthritis Mother   . Cancer Mother 53    breast  . Hypertension Mother   . Thyroid disease Mother     Graves Disease  . Hypertension Father   . Diabetes Father     pancreatitis induced  . Thyroid disease Sister     Graves Disease  . Thyroid disease Brother     Graves Disease  .  Hypertension Maternal Grandmother   . Arthritis Paternal Grandmother     Social History History  Substance Use Topics  . Smoking status: Never Smoker   . Smokeless tobacco: Never Used  . Alcohol Use: Yes    Allergies  Allergen Reactions  . Sulfa Antibiotics Swelling and Rash    Current Outpatient Prescriptions  Medication Sig Dispense Refill  . ENBREL SURECLICK 50 MG/ML injection Pt takes the 50mg /mL once a week, by injection      . fluticasone (FLONASE) 50 MCG/ACT nasal spray Place 2 sprays into the nose daily.      Marland Kitchen omeprazole (PRILOSEC) 40 MG capsule Take 1 capsule (40 mg total) by mouth daily.  30 capsule  3   No current facility-administered medications for this visit.    Review of Systems Review of Systems  Constitutional: Positive for appetite change.  Respiratory: Negative.   Cardiovascular: Negative.   Gastrointestinal: Positive for nausea, abdominal pain and diarrhea.    Blood pressure 138/78, pulse 78, resp. rate 12, height 5\' 1"  (1.549 m), weight 151 lb (68.493 kg), last menstrual period 12/11/2012.  Physical Exam Physical Exam  Constitutional: She is oriented to person, place, and time. She appears well-developed and well-nourished.  Cardiovascular: Normal rate,  regular rhythm and normal heart sounds.   No murmur heard. Pulmonary/Chest: Effort normal and breath sounds normal.  Abdominal: Soft. Normal appearance and bowel sounds are normal. There is no hepatosplenomegaly. There is no tenderness. No hernia.  Scar in lower abdomen.   Neurological: She is alert and oriented to person, place, and time.  Skin: Skin is warm and dry.    Data Reviewed Upper lower endoscopy is completed 06/06/2014 as well as the accompanying biopsy results reviewed. Of note increased intraepithelial lymphocytes noted in the duodenum with preservation of villous architecture. Colonic biopsy showing evidence of lymphocytic colitis.  GI notes of 07/15/2014 reviewed.  Abdominal  ultrasound dated 03/21/2014 reviewed, normal.  Hepatobiliary scan dated 07/18/2014 reviewed. Early, normal visualization of the gallbladder. Ejection fraction of 39% (lower limit of normal 30%). Bloating reported during CCK infusion. The patient denies any pain or true reproduction of her symptoms with CCK injection.  May 2015 office laboratory studies from her PCP reviewed. Of note elevated eosinophil count at that time (7%). Normal liver function studies. No evidence of anemia or thrombocytosis.   Assessment    Abdominal discomfort, nausea, bloating, unlikely related to the biliary tree.    Plan    I spoke in person with Dr. Gustavo Lah from the GI department after he had had a chance to review the patient's chart. Considering her long history of immunomodulation therapy for rheumatoid arthritis, as well as the identification of lymphocytic colitis an abnormal number of lymphocytes in the duodenal biopsy, a consideration has to be given to call lymphoma. In that direction a CT scan of the abdomen and pelvis will be obtained with contrast to assess the vitreous retroperitoneum (low suspicion for pancreatic process).  In speaking with Dr. Gustavo Lah he'll likely proceed with a small bowel follow through with or without a capsule study to better evaluate development of the small bowel mucosa with the lymphocytic infiltration noted on colonoscopy biopsy.  I spoke with the patient by phone the morning of 08/10/2014 to bring her up to date of my conversations with Dr. Gustavo Lah. She should expect to be contacted by his office by Friday at the latest to arrange for an evaluation with CT scan, presently scheduled for later this week.  Followup here will on an as needed basis.    PCP: Crecencio Mc Ref. MD: Dr. Pamella Pert, [place] 08/10/2014, 1:34 PM

## 2014-08-09 ENCOUNTER — Telehealth: Payer: Self-pay | Admitting: *Deleted

## 2014-08-09 NOTE — Telephone Encounter (Signed)
I talked with the patient about the CT abd/pelvis scan for Friday 08-11-14 at 9:30 and that she needs to pick up a prep kit and no solid foods 4 hours before test. She is aware Dr. Bary Castilla will be calling her with further details. Her work # is best for him to call.

## 2014-08-10 DIAGNOSIS — K52832 Lymphocytic colitis: Secondary | ICD-10-CM | POA: Insufficient documentation

## 2014-08-10 DIAGNOSIS — R112 Nausea with vomiting, unspecified: Secondary | ICD-10-CM | POA: Insufficient documentation

## 2014-08-10 DIAGNOSIS — K2980 Duodenitis without bleeding: Secondary | ICD-10-CM | POA: Insufficient documentation

## 2014-08-11 ENCOUNTER — Telehealth: Payer: Self-pay | Admitting: General Surgery

## 2014-08-11 ENCOUNTER — Ambulatory Visit: Payer: Self-pay | Admitting: General Surgery

## 2014-08-11 LAB — HCG, QUANTITATIVE, PREGNANCY: BETA HCG, QUANT.: 9 m[IU]/mL — AB

## 2014-08-11 NOTE — Telephone Encounter (Signed)
Notified CT w/o explanation for symptoms. (? Non-obstructing ureteral stones).  I think the left ovary is identified behind the sigmoid.  No vascular lesions or adenopathy.  She is to follow up w/ GI and GYN.

## 2014-08-14 ENCOUNTER — Encounter: Payer: Self-pay | Admitting: General Surgery

## 2014-08-21 ENCOUNTER — Encounter: Payer: Self-pay | Admitting: General Surgery

## 2014-11-04 ENCOUNTER — Other Ambulatory Visit: Payer: Self-pay | Admitting: Internal Medicine

## 2015-01-10 ENCOUNTER — Emergency Department: Payer: Self-pay | Admitting: Emergency Medicine

## 2015-05-29 ENCOUNTER — Telehealth: Payer: Self-pay | Admitting: General Surgery

## 2015-05-29 NOTE — Telephone Encounter (Signed)
PT CALLED & WANTED TO CATCH YOU UP ON HER JOUNERY. SHE LAST SAW YOU ON 08-14-14. SHE WANTED TO EXPRESS HOW [facility] SHE APPRECIATES YOU AND YOUR  TIME HAS MEANT TO HER. PLEASE CALL PT [phone]. THANK YOU.

## 2016-11-24 ENCOUNTER — Telehealth: Payer: Self-pay | Admitting: Internal Medicine

## 2016-11-24 NOTE — Telephone Encounter (Signed)
Ok to re-establish 

## 2016-11-24 NOTE — Telephone Encounter (Signed)
This patient wants to reestablish with Dr. Derrel Nip . She has not been seen since May 2015. Please advise.

## 2016-11-25 NOTE — Telephone Encounter (Signed)
Can you please call and schedule pt? Thanks!

## 2016-12-26 ENCOUNTER — Ambulatory Visit: Payer: Self-pay | Admitting: Internal Medicine

## 2017-02-02 ENCOUNTER — Emergency Department
Admission: EM | Admit: 2017-02-02 | Discharge: 2017-02-02 | Disposition: A | Discharge status: Home / Self Care | Payer: BLUE CROSS/BLUE SHIELD | Attending: Emergency Medicine | Admitting: Emergency Medicine

## 2017-02-02 ENCOUNTER — Ambulatory Visit (INDEPENDENT_AMBULATORY_CARE_PROVIDER_SITE_OTHER): Payer: BLUE CROSS/BLUE SHIELD | Admitting: Internal Medicine

## 2017-02-02 ENCOUNTER — Emergency Department: Payer: BLUE CROSS/BLUE SHIELD

## 2017-02-02 ENCOUNTER — Encounter: Payer: Self-pay | Admitting: Internal Medicine

## 2017-02-02 VITALS — BP 110/68 | HR 76 | Temp 98.0°F | Resp 17 | Ht 61.0 in | Wt 130.2 lb

## 2017-02-02 DIAGNOSIS — I10 Essential (primary) hypertension: Secondary | ICD-10-CM | POA: Diagnosis not present

## 2017-02-02 DIAGNOSIS — Z79899 Other long term (current) drug therapy: Secondary | ICD-10-CM | POA: Insufficient documentation

## 2017-02-02 DIAGNOSIS — E7800 Pure hypercholesterolemia, unspecified: Secondary | ICD-10-CM | POA: Diagnosis not present

## 2017-02-02 DIAGNOSIS — E860 Dehydration: Secondary | ICD-10-CM | POA: Diagnosis not present

## 2017-02-02 DIAGNOSIS — A09 Infectious gastroenteritis and colitis, unspecified: Secondary | ICD-10-CM

## 2017-02-02 DIAGNOSIS — Z803 Family history of malignant neoplasm of breast: Secondary | ICD-10-CM

## 2017-02-02 DIAGNOSIS — R112 Nausea with vomiting, unspecified: Secondary | ICD-10-CM

## 2017-02-02 DIAGNOSIS — R197 Diarrhea, unspecified: Secondary | ICD-10-CM | POA: Insufficient documentation

## 2017-02-02 DIAGNOSIS — K2970 Gastritis, unspecified, without bleeding: Secondary | ICD-10-CM

## 2017-02-02 DIAGNOSIS — R55 Syncope and collapse: Secondary | ICD-10-CM

## 2017-02-02 DIAGNOSIS — M05742 Rheumatoid arthritis with rheumatoid factor of left hand without organ or systems involvement: Secondary | ICD-10-CM | POA: Diagnosis not present

## 2017-02-02 DIAGNOSIS — K2990 Gastroduodenitis, unspecified, without bleeding: Secondary | ICD-10-CM

## 2017-02-02 DIAGNOSIS — M05741 Rheumatoid arthritis with rheumatoid factor of right hand without organ or systems involvement: Secondary | ICD-10-CM

## 2017-02-02 LAB — URINALYSIS, COMPLETE (UACMP) WITH MICROSCOPIC
BILIRUBIN URINE: NEGATIVE
Bacteria, UA: NONE SEEN
GLUCOSE, UA: 50 mg/dL — AB
Hgb urine dipstick: NEGATIVE
Ketones, ur: 80 mg/dL — AB
LEUKOCYTES UA: NEGATIVE
NITRITE: NEGATIVE
Protein, ur: NEGATIVE mg/dL
SPECIFIC GRAVITY, URINE: 1.019 (ref 1.005–1.030)
Squamous Epithelial / LPF: NONE SEEN
pH: 6 (ref 5.0–8.0)

## 2017-02-02 LAB — COMPREHENSIVE METABOLIC PANEL
ALT: 53 U/L (ref 14–54)
AST: 53 U/L — AB (ref 15–41)
Albumin: 4.3 g/dL (ref 3.5–5.0)
Alkaline Phosphatase: 111 U/L (ref 38–126)
Anion gap: 8 (ref 5–15)
BILIRUBIN TOTAL: 0.8 mg/dL (ref 0.3–1.2)
BUN: 18 mg/dL (ref 6–20)
CHLORIDE: 102 mmol/L (ref 101–111)
CO2: 28 mmol/L (ref 22–32)
CREATININE: 0.71 mg/dL (ref 0.44–1.00)
Calcium: 10.1 mg/dL (ref 8.9–10.3)
Glucose, Bld: 146 mg/dL — ABNORMAL HIGH (ref 65–99)
Potassium: 3.8 mmol/L (ref 3.5–5.1)
Sodium: 138 mmol/L (ref 135–145)
TOTAL PROTEIN: 8.6 g/dL — AB (ref 6.5–8.1)

## 2017-02-02 LAB — CBC
HEMATOCRIT: 46.3 % (ref 35.0–47.0)
Hemoglobin: 15.6 g/dL (ref 12.0–16.0)
MCH: 28.6 pg (ref 26.0–34.0)
MCHC: 33.7 g/dL (ref 32.0–36.0)
MCV: 84.7 fL (ref 80.0–100.0)
PLATELETS: 310 10*3/uL (ref 150–440)
RBC: 5.47 MIL/uL — AB (ref 3.80–5.20)
RDW: 13.4 % (ref 11.5–14.5)
WBC: 14.3 10*3/uL — AB (ref 3.6–11.0)

## 2017-02-02 LAB — TROPONIN I: Troponin I: <0.03 ng/mL (ref <0.03)

## 2017-02-02 LAB — LIPASE, BLOOD: LIPASE: 20 U/L (ref 11–51)

## 2017-02-02 MED ORDER — ONDANSETRON HCL 4 MG/2ML IJ SOLN
4.0000 mg | Freq: Once | INTRAMUSCULAR | Status: AC | PRN
  Administered 2017-02-02: 4 mg via INTRAVENOUS
  Filled 2017-02-02: qty 2

## 2017-02-02 MED ORDER — ONDANSETRON 4 MG PO TBDP: 4.0000 mg | ORAL_TABLET | Freq: Three times a day (TID) | ORAL | 0 refills | Status: DC | PRN

## 2017-02-02 MED ORDER — SODIUM CHLORIDE 0.9 % IV BOLUS (SEPSIS)
1000.0000 mL | Freq: Once | INTRAVENOUS | Status: AC
  Administered 2017-02-02: 1000 mL via INTRAVENOUS

## 2017-02-02 NOTE — Progress Notes (Signed)
Pre visit review using our clinic review tool, if applicable. No additional management support is needed unless otherwise documented below in the visit note. 

## 2017-02-02 NOTE — ED Notes (Signed)
Dr. Dahlia Client notified regarding hr of 107 on discharge. md orders discharge with this knowledge.
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
## 2017-02-02 NOTE — ED Triage Notes (Signed)
Pt states that this evening started feeling nauseated, pt states that she has vomited multiple times since 2130 with diarrhea, pt reports that she began to feel weak and husband found her passed out on the floor of the bathroom

## 2017-02-02 NOTE — ED Notes (Signed)
Report from matt, rn.  

## 2017-02-02 NOTE — Patient Instructions (Signed)
So good to see you!!  Glad  you are feeling better..    I will make a referral to Genetics/Oncology to help you get your breast cancer surveillance complete

## 2017-02-02 NOTE — ED Notes (Signed)
Patient transported to CT 

## 2017-02-02 NOTE — ED Notes (Signed)
Pt provided with ice chips for po challenge.

## 2017-02-02 NOTE — ED Provider Notes (Addendum)
[facility] Emergency Department Provider Note   ____________________________________________   First MD Initiated Contact with Patient 02/02/17 0240     (approximate)  I have reviewed the triage vital signs and the nursing notes.   HISTORY  Chief Complaint Loss of Consciousness    HPI Sherry Maxwell is a 51 y.o. female who comes into the hospital today with vomiting. She reports that she started vomiting at 9:30 and has vomited at least 7 times. She reports that she's had some diarrhea and between. Before she came she got up to use the restroom and passed out on the toilet.The patient reports that she couldn't keep anything down. The patient's husband reports that he heard a noise and by the time he got into the bathroom she was ready moving. She reports that she had eaten a grilled cheese sandwich this evening and it didn't taste right. She reports that she didn't finish up afterwards started to feel nauseous. The patient then after laying down and developed the vomiting and the diarrhea. She reports that she did feel little but dizzy prior to passing out. The patient reports that she had some mild abdominal discomfort in her back hurts a little bit. The patient had a few twinges of chest pain yesterday but nothing today. The patient is unsure if she hit her head. She is here today for evaluation. She was very sweaty according to the patient and her husband.   Past Medical History:  Diagnosis Date  . Colitis   . Depression   . Gastritis   . Hypertension   . rheumatoid arthritis   . Urinary tract infection     Patient Active Problem List   Diagnosis Date Noted  . Nausea with vomiting 08/10/2014  . Duodenitis determined by biopsy 08/10/2014  . Lymphocytic colitis 08/10/2014  . Other nonspecific abnormal serum enzyme levels 03/12/2014  . Unspecified gastritis and gastroduodenitis without mention of hemorrhage 03/10/2014  . Nausea alone 03/10/2014  .  Perimenopause 08/17/2013  . History of abnormal Pap smear 08/06/2013  . Grief reaction 08/06/2013  . Menometrorrhagia 12/24/2012  . Depression   . Rheumatoid arthritis(714.0) 12/23/2012    Past Surgical History:  Procedure Laterality Date  . COLONOSCOPY  05/2014  . [place] and 2011  . [place]   Born with a defect  . UPPER GI ENDOSCOPY  05/2014    Prior to Admission medications   Medication Sig Start Date End Date Taking? Authorizing Provider  ENBREL SURECLICK 50 MG/ML injection Pt takes the 50mg /mL once a week, by injection 12/07/12   Historical Provider, MD  fluticasone (FLONASE) 50 MCG/ACT nasal spray Place 2 sprays into the nose daily.    Historical Provider, MD  omeprazole (PRILOSEC) 40 MG capsule TAKE 1 CAPSULE (40 MG TOTAL) BY MOUTH DAILY. 11/06/14   Crecencio Mc, MD  ondansetron (ZOFRAN ODT) 4 MG disintegrating tablet Take 1 tablet (4 mg total) by mouth every 8 (eight) hours as needed for nausea or vomiting. 02/02/17   Loney Hering, MD    Allergies Sulfa antibiotics  Family History  Problem Relation Age of Onset  . Arthritis Mother   . Cancer Mother 16    breast  . Hypertension Mother   . Thyroid disease Mother     Graves Disease  . Hypertension Father   . Diabetes Father     pancreatitis induced  . Thyroid disease Sister     Graves Disease  .  Thyroid disease Brother     Graves Disease  . Hypertension Maternal Grandmother   . Arthritis Paternal Grandmother     Social History Social History  Substance Use Topics  . Smoking status: Never Smoker  . Smokeless tobacco: Never Used  . Alcohol use Yes    Review of Systems Constitutional: No fever/chills Eyes: No visual changes. ENT: No sore throat. Cardiovascular:  chest pain. Respiratory:  No shortness of breath. Gastrointestinal:  abdominal pain.   nausea,  vomiting.   diarrhea.  No constipation. Genitourinary: Negative for dysuria. Musculoskeletal: back pain. Skin:  Negative for rash. Neurological: Negative for headaches, focal weakness or numbness.  10-point ROS otherwise negative.  ____________________________________________   PHYSICAL EXAM:  VITAL SIGNS: ED Triage Vitals [02/02/17 0202]  Enc Vitals Group     BP 127/74     Pulse Rate 99     Resp 18     Temp 97.9 F (36.6 C)     Temp Source Oral     SpO2 100 %     Weight 125 lb (56.7 kg)     Height 5\' 1"  (1.549 m)     Head Circumference      Peak Flow      Pain Score 0     Pain Loc      Pain Edu?      Excl. in [place]?     Constitutional: Alert and oriented. Well appearing and in mild distress. Eyes: Conjunctivae are normal. PERRL. EOMI. Head: Atraumatic. Nose: No congestion/rhinnorhea. Mouth/Throat: Mucous membranes are moist.  Oropharynx non-erythematous. Neck: No cervical motion tenderness to palpation Cardiovascular: Normal rate, regular rhythm. Grossly normal heart sounds.  Good peripheral circulation. Respiratory: Normal respiratory effort.  No retractions. Lungs CTAB. Gastrointestinal: Soft and nontender. No distention. Musculoskeletal: No lower extremity tenderness nor edema.   Neurologic:  Normal speech and language. Cranial nerves II through XII are grossly intact with no focal motor or neuro deficits Skin:  Skin is warm, dry and intact.  Psychiatric: Mood and affect are normal.   ____________________________________________   LABS (all labs ordered are listed, but only abnormal results are displayed)  Labs Reviewed  COMPREHENSIVE METABOLIC PANEL - Abnormal; Notable for the following:       Result Value   Glucose, Bld 146 (*)    Total Protein 8.6 (*)    AST 53 (*)    All other components within normal limits  CBC - Abnormal; Notable for the following:    WBC 14.3 (*)    RBC 5.47 (*)    All other components within normal limits  URINALYSIS, COMPLETE (UACMP) WITH MICROSCOPIC - Abnormal; Notable for the following:    Color, Urine YELLOW (*)    APPearance CLEAR  (*)    Glucose, UA 50 (*)    Ketones, ur 80 (*)    All other components within normal limits  LIPASE, BLOOD  TROPONIN I   ____________________________________________  EKG  ED ECG REPORT I, Loney Hering, the attending physician, personally viewed and interpreted this ECG.   Date: 02/02/2017  EKG Time: 452  Rate: 101  Rhythm: sinus tachycardia  Axis: normal  Intervals:none  ST&T Change: none  ____________________________________________  RADIOLOGY  CT head ____________________________________________   PROCEDURES  Procedure(s) performed: None  Procedures  Critical Care performed: No  ____________________________________________   INITIAL IMPRESSION / ASSESSMENT AND PLAN / ED COURSE  Pertinent labs & imaging results that were available during my care of the patient were reviewed by me  and considered in my medical decision making (see chart for details).  This is a 51 year old female who comes into the hospital today with a syncopal episode at home. The patient though had been having some vomiting and diarrhea this evening and was likely dehydrated. She also had some dizziness with this. The patient received some Zofran IV as well as liter of normal saline. She reports that she feels much better after the fluid. I'll give the patient a second liter of normal saline and try to see if she can eat some ice chips. I will perform a CT scan of the patient's head since she is unsure if she hit her head as well as an EKG. I will reassess the patient once I have all of her studies and results available.  Clinical Course as of Feb 02 502  Mon Feb 02, 2017  0450 No acute intracranial abnormalities. CT Head Wo Contrast [AW]    Clinical Course User Index [AW] Loney Hering, MD   The patient is in some ice chips without vomiting. She'll be discharged home to follow-up with her primary care physician. The patient's heart rate was about 104 at the time of discharge.  We were talking and she reports that she feels much much better than she did when she initially arrived. As I feel she still has some minor dehydration I will allow her to be discharged as she is following up with her doctor at an appointment at 2:30. ____________________________________________   FINAL CLINICAL IMPRESSION(S) / ED DIAGNOSES  Final diagnoses:  Vasovagal syncope  Dehydration  Nausea vomiting and diarrhea      NEW MEDICATIONS STARTED DURING THIS VISIT:  New Prescriptions   ONDANSETRON (ZOFRAN ODT) 4 MG DISINTEGRATING TABLET    Take 1 tablet (4 mg total) by mouth every 8 (eight) hours as needed for nausea or vomiting.     Note:  This document was prepared using Dragon voice recognition software and may include unintentional dictation errors.    Loney Hering, MD 02/02/17 [place], MD 02/02/17 [phone]

## 2017-02-03 ENCOUNTER — Encounter: Payer: Self-pay | Admitting: Internal Medicine

## 2017-02-03 DIAGNOSIS — Z803 Family history of malignant neoplasm of breast: Secondary | ICD-10-CM | POA: Insufficient documentation

## 2017-02-03 LAB — COMPREHENSIVE METABOLIC PANEL
ALBUMIN: 4 g/dL (ref 3.5–5.2)
ALT: 34 U/L (ref 0–35)
AST: 28 U/L (ref 0–37)
Alkaline Phosphatase: 96 U/L (ref 39–117)
BUN: 9 mg/dL (ref 6–23)
CHLORIDE: 105 meq/L (ref 96–112)
CO2: 29 mEq/L (ref 19–32)
Calcium: 10.1 mg/dL (ref 8.4–10.5)
Creatinine, Ser: 0.65 mg/dL (ref 0.40–1.20)
GFR: 102.14 mL/min (ref >60.00)
GLUCOSE: 87 mg/dL (ref 70–99)
POTASSIUM: 4.2 meq/L (ref 3.5–5.1)
SODIUM: 138 meq/L (ref 135–145)
Total Bilirubin: 1 mg/dL (ref 0.2–1.2)
Total Protein: 7.4 g/dL (ref 6.0–8.3)

## 2017-02-03 LAB — LIPID PANEL
Cholesterol: 187 mg/dL (ref 0–200)
HDL: 65.5 mg/dL (ref >39.00)
LDL CALC: 111 mg/dL — AB (ref 0–99)
NonHDL: 121.06
Total CHOL/HDL Ratio: 3
Triglycerides: 50 mg/dL (ref 0.0–149.0)
VLDL: 10 mg/dL (ref 0.0–40.0)

## 2017-02-03 NOTE — Assessment & Plan Note (Addendum)
Suggestive of staph aureus food borne.  Repeat lytes pending   Lab Results  Component Value Date   NA 138 02/02/2017   K 3.8 02/02/2017   CL 102 02/02/2017   CO2 28 02/02/2017   Lab Results  Component Value Date   CREATININE 0.71 02/02/2017

## 2017-02-03 NOTE — Assessment & Plan Note (Signed)
Managed with enbrel. diagnosed at age 51

## 2017-02-03 NOTE — Assessment & Plan Note (Signed)
Current episode appears to be food borne  And resolved.  hhistory of gi issues , now managed with holistic diet.

## 2017-02-03 NOTE — Assessment & Plan Note (Signed)
Both mother and sister have been diagnosed,  Tumors are very different (see HPI).  Referral to genetics counselling for testing and referral to high risk breast clinic in hopes of getting breast MRI annually covered by insurance instead of 3 d mammogram which is now due and done by her gynecologist March 2017 ([facility])

## 2017-02-16 DIAGNOSIS — Z1379 Encounter for other screening for genetic and chromosomal anomalies: Secondary | ICD-10-CM | POA: Insufficient documentation

## 2017-02-16 NOTE — Progress Notes (Signed)
[facility]  [phone] [phone]  ID: JOHNNI Maxwell OB: May 01, 1966  MR#: [id number]  XBD#:[id number]  Patient Care Team: Crecencio Mc, MD as PCP - General (Internal Medicine) Crecencio Mc, MD (Internal Medicine) Lollie Sails, MD as Physician Assistant (Internal Medicine) Robert Bellow, MD (General Surgery) Lollie Sails, MD as Physician Assistant (Internal Medicine) Robert Bellow, MD (General Surgery)  CHIEF COMPLAINT: Genetic testing and counseling.  INTERVAL HISTORY: Patient is a 51 year old female who has a family history significant with mother with AAA negative breast cancer disease and a sister in her 48s with HER-2 positive disease. She is referred to clinic for counseling and discussion of screening options. She is anxious, but otherwise feels well. She has no neurologic complaints. She denies any recent fevers or illnesses. She denies any pain. She has a good appetite and denies weight loss. She has no chest pain or shortness of breath. She denies any nausea, vomiting, constipation, or diarrhea. She has no urinary complaints. Patient feels at her baseline and offers no specific complaints today.  REVIEW OF SYSTEMS:   Review of Systems  Constitutional: Negative.  Negative for fever, malaise/fatigue and weight loss.  Respiratory: Negative.  Negative for cough and shortness of breath.   Cardiovascular: Negative.  Negative for chest pain and leg swelling.  Gastrointestinal: Negative.  Negative for abdominal pain.  Genitourinary: Negative.   Musculoskeletal: Negative.   Skin: Negative.  Negative for rash.  Neurological: Negative.  Negative for weakness.  Psychiatric/Behavioral: Negative.  The patient is not nervous/anxious.     As per HPI. Otherwise, a complete review of systems is negative.  PAST MEDICAL HISTORY: Past Medical History:  Diagnosis Date  . Colitis   . Depression   . Gastritis   . Hypertension     . rheumatoid arthritis   . Urinary tract infection     PAST SURGICAL HISTORY: Past Surgical History:  Procedure Laterality Date  . COLONOSCOPY  05/2014  . [place] and 2011  . [place]   Born with a defect  . UPPER GI ENDOSCOPY  05/2014    FAMILY HISTORY: Family History  Problem Relation Age of Onset  . Arthritis Mother   . Hypertension Mother   . Thyroid disease Mother     Graves Disease  . Breast cancer Mother 31    breast  . Hypertension Father   . Diabetes Father     pancreatitis induced  . Thyroid disease Sister     Graves Disease  . Breast cancer Sister 1  . Thyroid disease Brother     Graves Disease  . Hypertension Maternal Grandmother   . Arthritis Paternal Grandmother     ADVANCED DIRECTIVES (Y/N):  N  HEALTH MAINTENANCE: Social History  Substance Use Topics  . Smoking status: Never Smoker  . Smokeless tobacco: Never Used  . Alcohol use Yes     Colonoscopy:  PAP:  Bone density:  Lipid panel:  Allergies  Allergen Reactions  . Sulfa Antibiotics Swelling and Rash    Current Outpatient Prescriptions  Medication Sig Dispense Refill  . ENBREL SURECLICK 50 MG/ML injection Pt takes the 58m/mL once a week, by injection    . fluticasone (FLONASE) 50 MCG/ACT nasal spray Place 2 sprays into the nose daily.    .Marland Kitchenomeprazole (PRILOSEC) 40 MG capsule TAKE 1 CAPSULE (40 MG TOTAL) BY MOUTH DAILY. (Patient not taking: Reported on 02/02/2017) 30 capsule 3  .  ondansetron (ZOFRAN ODT) 4 MG disintegrating tablet Take 1 tablet (4 mg total) by mouth every 8 (eight) hours as needed for nausea or vomiting. (Patient not taking: Reported on 02/17/2017) 20 tablet 0   No current facility-administered medications for this visit.     OBJECTIVE: Vitals:   02/17/17 1215  BP: 133/84  Pulse: 82  Resp: 18  Temp: 97.4 F (36.3 C)     Body mass index is 24.71 kg/m.    ECOG FS:0 - Asymptomatic  General: Well-developed, well-nourished, no acute  distress. Eyes: Pink conjunctiva, anicteric sclera. Musculoskeletal: No edema, cyanosis, or clubbing. Neuro: Alert, answering all questions appropriately. Cranial nerves grossly intact. Skin: No rashes or petechiae noted. Psych: Normal affect.   LAB RESULTS:  Lab Results  Component Value Date   NA 138 02/02/2017   K 4.2 02/02/2017   CL 105 02/02/2017   CO2 29 02/02/2017   GLUCOSE 87 02/02/2017   BUN 9 02/02/2017   CREATININE 0.65 02/02/2017   CALCIUM 10.1 02/02/2017   PROT 7.4 02/02/2017   ALBUMIN 4.0 02/02/2017   AST 28 02/02/2017   ALT 34 02/02/2017   ALKPHOS 96 02/02/2017   BILITOT 1.0 02/02/2017   GFRNONAA >60 02/02/2017   GFRAA >60 02/02/2017    Lab Results  Component Value Date   WBC 14.3 (H) 02/02/2017   NEUTROABS 4.7 03/10/2014   HGB 15.6 02/02/2017   HCT 46.3 02/02/2017   MCV 84.7 02/02/2017   PLT 310 02/02/2017     STUDIES: Ct Head Wo Contrast  Result Date: 02/02/2017 CLINICAL DATA:  Nausea, vomiting, and diarrhea. Subsequent weakness and syncope. Passed out and fell to the floor with head injury. EXAM: CT HEAD WITHOUT CONTRAST TECHNIQUE: Contiguous axial images were obtained from the base of the skull through the vertex without intravenous contrast. COMPARISON:  MRI brain 01/16/2007 FINDINGS: Brain: No evidence of acute infarction, hemorrhage, hydrocephalus, extra-axial collection or mass lesion/mass effect. Vascular: No hyperdense vessel or unexpected calcification. Skull: Normal. Negative for fracture or focal lesion. Sinuses/Orbits: No acute finding. Other: None. IMPRESSION: No acute intracranial abnormalities. Electronically Signed   By: Lucienne Capers M.D.   On: 02/02/2017 04:42    ASSESSMENT: Genetic testing and counseling.  PLAN:    1. Genetic testing and counseling: Patient does not have a personal history of breast cancer. Her mother is alive and well and diagnosed with a triple negative breast cancer in her 50s. Her sister is also alive and  well and was diagnosed with triple positive breast cancer in her 56s. Both her mother and sister were tested and found to be BRCA 1&2 negative. Patient does not desire to undergo genetic testing today. Previously an MRI of her breasts was recommended, but insurance would not cover this procedure and patient was unwilling to pay out of pocket. We had a lengthy discussion of her increased risk of developing cancer given her family history. Have recommended that she continue with her yearly mammograms as ordered at least until the age of 39 or 17. Use of breast MRI in this setting is controversial. Patient was pleased with the discussion and no follow-up has been scheduled.  Approximate 60 minutes was spent in discussion of which greater than 50% was consultation.   Patient expressed understanding and was in agreement with this plan. She also understands that She can call clinic at any time with any questions, concerns, or complaints.    Lloyd Huger, MD   02/18/2017 3:04 PM

## 2017-02-17 ENCOUNTER — Inpatient Hospital Stay: Payer: BLUE CROSS/BLUE SHIELD | Attending: Oncology | Admitting: Oncology

## 2017-02-17 ENCOUNTER — Encounter: Payer: Self-pay | Admitting: Oncology

## 2017-02-17 DIAGNOSIS — F329 Major depressive disorder, single episode, unspecified: Secondary | ICD-10-CM

## 2017-02-17 DIAGNOSIS — Z87440 Personal history of urinary (tract) infections: Secondary | ICD-10-CM | POA: Diagnosis not present

## 2017-02-17 DIAGNOSIS — I10 Essential (primary) hypertension: Secondary | ICD-10-CM

## 2017-02-17 DIAGNOSIS — R112 Nausea with vomiting, unspecified: Secondary | ICD-10-CM | POA: Diagnosis not present

## 2017-02-17 DIAGNOSIS — M069 Rheumatoid arthritis, unspecified: Secondary | ICD-10-CM

## 2017-02-17 DIAGNOSIS — R197 Diarrhea, unspecified: Secondary | ICD-10-CM | POA: Insufficient documentation

## 2017-02-17 DIAGNOSIS — F419 Anxiety disorder, unspecified: Secondary | ICD-10-CM | POA: Diagnosis not present

## 2017-02-17 DIAGNOSIS — Z8719 Personal history of other diseases of the digestive system: Secondary | ICD-10-CM | POA: Diagnosis not present

## 2017-02-17 DIAGNOSIS — R531 Weakness: Secondary | ICD-10-CM

## 2017-02-17 DIAGNOSIS — Z1379 Encounter for other screening for genetic and chromosomal anomalies: Secondary | ICD-10-CM | POA: Diagnosis not present

## 2017-02-17 DIAGNOSIS — Z803 Family history of malignant neoplasm of breast: Secondary | ICD-10-CM | POA: Diagnosis not present

## 2017-02-17 DIAGNOSIS — R55 Syncope and collapse: Secondary | ICD-10-CM | POA: Diagnosis not present

## 2017-07-15 NOTE — Telephone Encounter (Signed)
Error

## 2017-08-04 ENCOUNTER — Ambulatory Visit (INDEPENDENT_AMBULATORY_CARE_PROVIDER_SITE_OTHER): Payer: BLUE CROSS/BLUE SHIELD | Admitting: Internal Medicine

## 2017-08-04 ENCOUNTER — Encounter: Payer: Self-pay | Admitting: Internal Medicine

## 2017-08-04 DIAGNOSIS — K52832 Lymphocytic colitis: Secondary | ICD-10-CM | POA: Diagnosis not present

## 2017-08-04 DIAGNOSIS — M05741 Rheumatoid arthritis with rheumatoid factor of right hand without organ or systems involvement: Secondary | ICD-10-CM | POA: Diagnosis not present

## 2017-08-04 DIAGNOSIS — J0110 Acute frontal sinusitis, unspecified: Secondary | ICD-10-CM | POA: Diagnosis not present

## 2017-08-04 DIAGNOSIS — M05742 Rheumatoid arthritis with rheumatoid factor of left hand without organ or systems involvement: Secondary | ICD-10-CM | POA: Diagnosis not present

## 2017-08-04 MED ORDER — LEVOFLOXACIN 500 MG PO TABS: 500.0000 mg | ORAL_TABLET | Freq: Every day | ORAL | 0 refills | Status: DC

## 2017-08-04 MED ORDER — PREDNISONE 10 MG PO TABS: ORAL_TABLET | ORAL | 0 refills | Status: DC

## 2017-08-04 NOTE — Progress Notes (Signed)
Subjective:  Patient ID: Sherry Maxwell, female    DOB: Dec 13, 1965  Age: 51 y.o. MRN: [id number]  CC: Diagnoses of Lymphocytic colitis, Acute frontal sinusitis, recurrence not specified, and Rheumatoid arthritis involving both hands with positive rheumatoid factor ([place]) were pertinent to this visit.  HPI Sherry Maxwell presents for follow up.   Eating out a lot due to remodeling ,    Off of gluten free diet which helped GI issues .  Tested negative for celiac disease .  Drinking  kombucha ,  Making her own yogurt. .    Has RA. Managed with Enbrel by Dr Jefm Bryant.  Very 8 weeks.  Mot progressing.  Some swan neck deformities  Not paimfu    Stools are more loose but no pain  Sees [facility] pr[facility]   URi for two weeks ,  Still drainign but now clear. No facial or ear pain  but previously thick geen drainage.  started using KAMU.   Right ear red.     Outpatient Medications Prior to Visit  Medication Sig Dispense Refill  . ENBREL SURECLICK 50 MG/ML injection Pt takes the 50mg /mL once a week, by injection    . fluticasone (FLONASE) 50 MCG/ACT nasal spray Place 2 sprays into the nose daily.    Marland Kitchen omeprazole (PRILOSEC) 40 MG capsule TAKE 1 CAPSULE (40 MG TOTAL) BY MOUTH DAILY. (Patient not taking: Reported on 02/02/2017) 30 capsule 3  . ondansetron (ZOFRAN ODT) 4 MG disintegrating tablet Take 1 tablet (4 mg total) by mouth every 8 (eight) hours as needed for nausea or vomiting. (Patient not taking: Reported on 02/17/2017) 20 tablet 0   No facility-administered medications prior to visit.     Review of Systems;  Patient denies headache, fevers, malaise, unintentional weight loss, skin rash, eye pain, sinus congestion and sinus pain, sore throat, dysphagia,  hemoptysis , cough, dyspnea, wheezing, chest pain, palpitations, orthopnea, edema, abdominal pain, nausea, melena, diarrhea, constipation, flank pain, dysuria, hematuria, urinary  Frequency, nocturia, numbness, tingling, seizures,  Focal weakness,  Loss of consciousness,  Tremor, insomnia, depression, anxiety, and suicidal ideation.      Objective:  BP 120/72 (BP Location: Left Arm, Patient Position: Sitting, Cuff Size: Normal)   Pulse 73   Temp 98.4 F (36.9 C) (Oral)   Resp 14   Ht 5\' 1"  (1.549 m)   Wt 135 lb 9.6 oz (61.5 kg)   SpO2 97%   BMI 25.62 kg/m   BP Readings from Last 3 Encounters:  08/04/17 120/72  02/17/17 133/84  02/02/17 110/68    Wt Readings from Last 3 Encounters:  08/04/17 135 lb 9.6 oz (61.5 kg)  02/17/17 130 lb 12.8 oz (59.3 kg)  02/02/17 130 lb 3.2 oz (59.1 kg)    General appearance: alert, cooperative and appears stated age Ears: mildly erythema right ear TM,  Normal left TM and external ear canals both ears Throat: lips, mucosa, and tongue normal; teeth and gums normal Neck: no adenopathy, no carotid bruit, supple, symmetrical, trachea midline and thyroid not enlarged, symmetric, no tenderness/mass/nodules Back: symmetric, no curvature. ROM normal. No CVA tenderness. Lungs: clear to auscultation bilaterally Heart: regular rate and rhythm, S1, S2 normal, no murmur, click, rub or gallop Abdomen: soft, non-tender; bowel sounds normal; no masses,  no organomegaly Pulses: 2+ and symmetric Skin: Skin color, texture, turgor normal. No rashes or lesions Lymph nodes: Cervical, supraclavicular, and axillary nodes normal.  No results found for: HGBA1C  Lab Results  Component Value Date  CREATININE 0.65 02/02/2017   CREATININE 0.71 02/02/2017   CREATININE 0.7 03/10/2014    Lab Results  Component Value Date   WBC 14.3 (H) 02/02/2017   HGB 15.6 02/02/2017   HCT 46.3 02/02/2017   PLT 310 02/02/2017   GLUCOSE 87 02/02/2017   CHOL 187 02/02/2017   TRIG 50.0 02/02/2017   HDL 65.50 02/02/2017   LDLCALC 111 (H) 02/02/2017   ALT 34 02/02/2017   AST 28 02/02/2017   NA 138 02/02/2017   K 4.2 02/02/2017   CL 105 02/02/2017   CREATININE 0.65 02/02/2017   BUN 9 02/02/2017   CO2 29 02/02/2017    TSH 1.40 12/23/2012    Ct Head Wo Contrast  Result Date: 02/02/2017 CLINICAL DATA:  Nausea, vomiting, and diarrhea. Subsequent weakness and syncope. Passed out and fell to the floor with head injury. EXAM: CT HEAD WITHOUT CONTRAST TECHNIQUE: Contiguous axial images were obtained from the base of the skull through the vertex without intravenous contrast. COMPARISON:  MRI brain 01/16/2007 FINDINGS: Brain: No evidence of acute infarction, hemorrhage, hydrocephalus, extra-axial collection or mass lesion/mass effect. Vascular: No hyperdense vessel or unexpected calcification. Skull: Normal. Negative for fracture or focal lesion. Sinuses/Orbits: No acute finding. Other: None. IMPRESSION: No acute intracranial abnormalities. Electronically Signed   By: Lucienne Capers M.D.   On: 02/02/2017 04:42    Assessment & Plan:   Problem List Items Addressed This Visit    Lymphocytic colitis    By biopsy in prior year.  Symptoms managed with gluten free diet.       Rheumatoid arthritis ([place])    Managed with intermittent use of Enbrel.       Relevant Medications   predniSONE (DELTASONE) 10 MG tablet   Sinusitis, acute    With right sided otitis on exam   Levaquin. And prednsione        Relevant Medications   levofloxacin (LEVAQUIN) 500 MG tablet   predniSONE (DELTASONE) 10 MG tablet     A total of 25 minutes of face to face time was spent with patient more than half of which was spent in counselling about the above mentioned conditions  and coordination of care  I have discontinued Ms. Pflieger's fluticasone, omeprazole, and ondansetron. I am also having her start on levofloxacin and predniSONE. Additionally, I am having her maintain her ENBREL SURECLICK.  Meds ordered this encounter  Medications  . levofloxacin (LEVAQUIN) 500 MG tablet    Sig: Take 1 tablet (500 mg total) by mouth daily.    Dispense:  7 tablet    Refill:  0  . predniSONE (DELTASONE) 10 MG tablet    Sig: 6 tablets on Day 1 , then  reduce by 1 tablet daily until gone    Dispense:  21 tablet    Refill:  0    Medications Discontinued During This Encounter  Medication Reason  . fluticasone (FLONASE) 50 MCG/ACT nasal spray Patient has not taken in last 30 days  . omeprazole (PRILOSEC) 40 MG capsule Patient has not taken in last 30 days  . ondansetron (ZOFRAN ODT) 4 MG disintegrating tablet Patient has not taken in last 30 days    Follow-up: No Follow-up on file.   Crecencio Mc, MD

## 2017-08-04 NOTE — Patient Instructions (Signed)
I am treating you for sinusitis/otitis which is a complication from your viral infection due to  persistent sinus congestion.   I am prescribing an antibiotic (levaquin) and a prednisone taper  To manage the infection and the inflammation in your ear/sinuses.   I also advise use of the following OTC meds to help with your other symptoms.   Take generic OTC benadryl 25 mg every 8 hours for the drainage,  Sudafed PE  10 to 30 mg every 8 hours for the congestion, you may substitute Afrin nasal spray for the nighttime dose of sudafed PE  If needed to prevent insomnia.     The next time you start to get sick , flush your sinuses  Once daily using Milta [facility] sinus rinse (do over the sink because if you do it right you will spit out globs of mucus)

## 2017-08-04 NOTE — Assessment & Plan Note (Addendum)
With right sided otitis on exam   Levaquin. And prednsione

## 2017-08-05 NOTE — Assessment & Plan Note (Signed)
By biopsy in prior year.  Symptoms managed with gluten free diet.

## 2017-08-05 NOTE — Assessment & Plan Note (Signed)
Managed with intermittent use of Enbrel.

## 2018-11-15 ENCOUNTER — Ambulatory Visit: Payer: BLUE CROSS/BLUE SHIELD | Admitting: Family Medicine

## 2018-11-15 ENCOUNTER — Encounter: Payer: Self-pay | Admitting: Family Medicine

## 2018-11-15 VITALS — BP 130/80 | HR 110 | Temp 101.8°F | Ht 61.0 in | Wt 144.2 lb

## 2018-11-15 DIAGNOSIS — R6889 Other general symptoms and signs: Secondary | ICD-10-CM

## 2018-11-15 LAB — POC INFLUENZA A&B (BINAX/QUICKVUE)
INFLUENZA A, POC: NEGATIVE
INFLUENZA B, POC: NEGATIVE

## 2018-11-15 NOTE — Assessment & Plan Note (Signed)
Clinically patient has likely influenza.  Rapid flu test was negative though her symptoms are likely related to influenza given positive flu contact.  Discussed treating with Tamiflu though she declined this.  Discussed supportive care with hydration and Tylenol or ibuprofen for discomfort or fever.  If not improving over the next 3 to 5 days she will contact us.  Given return precautions.

## 2018-11-15 NOTE — Patient Instructions (Signed)
Nice to see you. Please stay well-hydrated. You can take Tylenol or ibuprofen for fevers or body aches. If you develop worsening symptoms, shortness of breath, cough productive of blood, persistent fever, or any new or changing symptoms please seek medical attention immediately.

## 2018-11-15 NOTE — Progress Notes (Signed)
  Tommi Rumps, MD Phone: [phone]  Sherry Maxwell is a 53 y.o. female who presents today for same-day visit.  CC: Cough  Patient notes onset of symptoms fairly quickly yesterday.  Symptoms include postnasal drip, body aches, chills, and T-max of 100.8 F at home.  One episode of vomiting of clear mucus.  Some nausea.  No abdominal pain.  No change to her chronic diarrhea.  She is not getting much out of her nose.  She has had some headaches.  She is trying to drink plenty of fluids.  She is not eating well.  She is taken some Tylenol.  She does have a grandchild that was diagnosed with flu and her husband has had similar symptoms.  Social History   Tobacco Use  Smoking Status Never Smoker  Smokeless Tobacco Never Used     ROS see history of present illness  Objective  Physical Exam Vitals:   11/15/18 1539 11/15/18 1609  BP: 130/80   Pulse: (!) 118 (!) 110  Temp: (!) 101.8 F (38.8 C)   SpO2: 97%     BP Readings from Last 3 Encounters:  11/15/18 130/80  08/04/17 120/72  02/17/17 133/84   Wt Readings from Last 3 Encounters:  11/15/18 144 lb 3.2 oz (65.4 kg)  08/04/17 135 lb 9.6 oz (61.5 kg)  02/17/17 130 lb 12.8 oz (59.3 kg)    Physical Exam Constitutional:      General: She is not in acute distress.    Appearance: She is not diaphoretic.  HENT:     Head: Normocephalic and atraumatic.     Right Ear: Tympanic membrane normal.     Left Ear: Tympanic membrane normal.     Nose: No congestion.     Mouth/Throat:     Mouth: Mucous membranes are moist.     Pharynx: Posterior oropharyngeal erythema present. No oropharyngeal exudate.  Eyes:     Conjunctiva/sclera: Conjunctivae normal.     Pupils: Pupils are equal, round, and reactive to light.  Cardiovascular:     Rate and Rhythm: Normal rate and regular rhythm.     Heart sounds: Normal heart sounds.  Pulmonary:     Effort: Pulmonary effort is normal.     Breath sounds: Normal breath sounds.  Skin:  General: Skin is warm and dry.  Neurological:     Mental Status: She is alert.      Assessment/Plan: Please see individual problem list.  Flu-like symptoms Clinically patient has likely influenza.  Rapid flu test was negative though her symptoms are likely related to influenza given positive flu contact.  Discussed treating with Tamiflu though she declined this.  Discussed supportive care with hydration and Tylenol or ibuprofen for discomfort or fever.  If not improving over the next 3 to 5 days she will contact us.  Given return precautions.    Orders Placed This Encounter  Procedures  . POC Influenza A&B(BINAX/QUICKVUE)    No orders of the defined types were placed in this encounter.    Tommi Rumps, MD [place]

## 2018-11-17 ENCOUNTER — Ambulatory Visit: Payer: Self-pay

## 2018-11-17 NOTE — Telephone Encounter (Signed)
Scheduled patient with Ander Purpura for tomorrow refused UC tonight. Advised patient SOB throat starts swelling she needs to be seen sooner.

## 2018-11-17 NOTE — Telephone Encounter (Signed)
Patient called and says she was seen in the office on 1/27 by Dr. Caryl Bis and her symptoms have gotten worse since then. She says her nose is so clogged up that she can't breathe through it. She says she is coughing and it's keeping her up at night, coughing up phlegm and sometimes it's a dry cough. She says she tries to blow her nose, but gets so nauseated she throws up. She says she tries to eat, but doesn't have an appetite. She says this morning her fever broke, it was 98.7, but now it's up to 100.9, so she's going to take Tylenol. She says she doesn't have body aches or headache any more, but her throat is still sore and raw. I advised it was noted to return for worsening symptoms, no availability with any provider in the practice today. I advised I will send to Dr. Caryl Bis for his review and someone will call with his recommendation, she verbalized understanding. She says her husband is having symptoms as well.   Reason for Disposition . Fever present > 3 days (72 hours)  Answer Assessment - Initial Assessment Questions 1. WORST SYMPTOM: "What is your worst symptom?" (e.g., cough, runny nose, muscle aches, headache, sore throat, fever)      Cough, nasal congestion, fever 2. ONSET: "When did your flu symptoms start?"      Monday, 11/15/18; cough and congestion started Tuesday 3. COUGH: "How bad is the cough?"       Keeps me up at night 4. RESPIRATORY DISTRESS: "Describe your breathing."      Can't breathe out my nose 5. FEVER: "Do you have a fever?" If so, ask: "What is your temperature, how was it measured, and when did it start?"     100.9 6. EXPOSURE: "Were you exposed to someone with influenza?"       Yes, granddaughter 7. FLU VACCINE: "Did you get a flu shot this year?"     No 8. HIGH RISK DISEASE: "Do you any chronic medical problems?" (e.g., heart or lung disease, asthma, weak immune system, or other HIGH RISK conditions)     Rheumatoid Arthritis 9. PREGNANCY: "Is there any  chance you are pregnant?" "When was your last menstrual period?"     No 10. OTHER SYMPTOMS: "Do you have any other symptoms?"  (e.g., runny nose, muscle aches, headache, sore throat)      Sore throat  Protocols used: INFLUENZA - SEASONAL-A-AH

## 2018-11-18 ENCOUNTER — Encounter: Payer: Self-pay | Admitting: Family Medicine

## 2018-11-18 ENCOUNTER — Ambulatory Visit: Payer: BLUE CROSS/BLUE SHIELD | Admitting: Family Medicine

## 2018-11-18 VITALS — BP 100/62 | HR 109 | Temp 98.6°F | Resp 16 | Wt 145.0 lb

## 2018-11-18 DIAGNOSIS — J101 Influenza due to other identified influenza virus with other respiratory manifestations: Secondary | ICD-10-CM

## 2018-11-18 LAB — POC INFLUENZA A&B (BINAX/QUICKVUE)
INFLUENZA A, POC: POSITIVE — AB
INFLUENZA B, POC: NEGATIVE

## 2018-11-18 LAB — POCT RAPID STREP A (OFFICE): Rapid Strep A Screen: NEGATIVE

## 2018-11-18 NOTE — Progress Notes (Signed)
Subjective:    Patient ID: Sherry Maxwell, female    DOB: 09-20-1966, 53 y.o.   MRN: [id number]  HPI   Patient presents the clinic complaining of fever, nasal congestion, cough and sore throat for 4-5 days.  Patient was exposed to flu over the weekend from her grandchildren.  She was seen here on Monday, 11/15/2018, flu swab at that time in clinic to test negative however it was suspected she did have influenza due to her positive flu contact and combination of symptoms. She declined tamiflu.  Patient states yesterday was probably the worst day she has felt, had fever and was achy all over, cough is harsh and rattling, felt very congested in her nose and face.  Patient states she reached out to her sister who is a pharmacist that suggested she take Allegra-D, do saline nasal flushes and use over-the-counter Delsym cough syrup.  Patient states she did all these things as advised by her sister and upon waking today she was feeling better.  Continues to have some phlegm with cough, so was concerned that she was developing respiratory infection.   Patient Active Problem List   Diagnosis Date Noted  . Flu-like symptoms 11/15/2018  . Genetic testing 02/16/2017  . Family history of breast cancer in first degree relative 02/03/2017  . Lymphocytic colitis 08/10/2014  . Gastritis and gastroduodenitis 03/10/2014  . Menopause 08/17/2013  . History of abnormal Pap smear 08/06/2013  . Sinusitis, acute 08/03/2013  . Depression   . Rheumatoid arthritis ([place]) 12/23/2012   Social History   Tobacco Use  . Smoking status: Never Smoker  . Smokeless tobacco: Never Used  Substance Use Topics  . Alcohol use: Yes   Review of Systems  Constitutional: + chills, fatigue and fever.  HENT: + congestion, ear pain, sinus pain and sore throat.   Eyes: Negative.   Respiratory: +cough. Negative for shortness of breath and wheezing.   Cardiovascular: Negative for chest pain, palpitations and leg swelling.    Gastrointestinal: Negative for abdominal pain, diarrhea, nausea and vomiting.  Genitourinary: Negative for dysuria, frequency and urgency.  Musculoskeletal: +body aches.  Skin: Negative for color change, pallor and rash.  Neurological: Negative for syncope, light-headedness and headaches.  Psychiatric/Behavioral: The patient is not nervous/anxious.       Objective:   Physical Exam Vitals signs and nursing note reviewed.  Constitutional:      General: She is not in acute distress.    Appearance: She is not toxic-appearing.  HENT:     Head: Normocephalic and atraumatic.     Nose: Congestion and rhinorrhea present.     Comments: +post nasal drip and clear/white nasal discharge    Mouth/Throat:     Mouth: Mucous membranes are moist.  Eyes:     General: No scleral icterus.    Extraocular Movements: Extraocular movements intact.     Conjunctiva/sclera: Conjunctivae normal.  Neck:     Musculoskeletal: Neck supple. No neck rigidity.  Cardiovascular:     Rate and Rhythm: Normal rate and regular rhythm.     Heart sounds: Normal heart sounds.  Pulmonary:     Effort: Pulmonary effort is normal. No respiratory distress.     Breath sounds: Normal breath sounds. No wheezing, rhonchi or rales.  Lymphadenopathy:     Cervical: No cervical adenopathy.  Skin:    General: Skin is warm and dry.     Coloration: Skin is not jaundiced or pale.  Neurological:     Mental Status: She  is alert and oriented to person, place, and time.     Gait: Gait normal.  Psychiatric:        Mood and Affect: Mood normal.        Behavior: Behavior normal.     Vitals:   11/18/18 1551  BP: 100/62  Pulse: (!) 109  Resp: 16  Temp: 98.6 F (37 C)  SpO2: 98%       Assessment & Plan:   Influenza A - point-of-care flu swab in clinic is positive for influenza today.  Patient states after taking the Allegra-D and Delsym, she is feeling better today.  She will continue to keep up good fluid intake, alternate  Tylenol and Motrin as needed for any aches or fevers, use Allegra-D and Delsym to help with congestion and cough symptoms.  Reassured patient that her lungs do sound clear on exam and that her cough is most likely related to influenza illness and combination of postnasal drainage.  Patient will keep regularly scheduled follow-up with PCP as planned.  Advised to return to clinic sooner if current symptoms worsen rather than continue to improve.

## 2018-11-19 ENCOUNTER — Telehealth: Payer: Self-pay | Admitting: *Deleted

## 2018-11-19 ENCOUNTER — Telehealth: Payer: Self-pay | Admitting: Internal Medicine

## 2018-11-19 DIAGNOSIS — J101 Influenza due to other identified influenza virus with other respiratory manifestations: Secondary | ICD-10-CM

## 2018-11-19 DIAGNOSIS — R059 Cough, unspecified: Secondary | ICD-10-CM

## 2018-11-19 DIAGNOSIS — R05 Cough: Secondary | ICD-10-CM

## 2018-11-19 MED ORDER — BENZONATATE 100 MG PO CAPS: 200.0000 mg | ORAL_CAPSULE | Freq: Three times a day (TID) | ORAL | 0 refills | Status: DC | PRN

## 2018-11-19 NOTE — Telephone Encounter (Signed)
Copied from [place] [id number]. Topic: Compliment - Staff >> Nov 19, 2018  2:29 PM Selinda Flavin B, NT wrote: **Please forward to [employer]s**  Date of encounter: 11/18/2018 Details of compliment: Patient calling and states that she would like for the staff to know how grateful she is with the care she received on 11/18/2018. States that the front desk receptionist was so kind. Everyone that she came in contact with yesterday seemed very concerned about her. Also mentions the nurse triage calling her back and was also kind and concerned. Wanted everyone to know that she felt very valued and cared for when she did not feel her best.  Who would the patient like to thank/see rewarded? Staff at office and triage nurse On a scale of 1-10, how was your experience?   Route to Engineer, building services.

## 2018-11-19 NOTE — Telephone Encounter (Signed)
Copied from [place] [id number]. Topic: Quick Communication - See Telephone Encounter >> Nov 19, 2018  2:26 PM Rutherford Nail, NT wrote: CRM for notification. See Telephone encounter for: 11/19/18. Patient calling and states that she was seen by Philis Nettle yesterday and was offered a cough medication. Declined at visit stating that she wanted to try delsum first. Patient is now requesting a cough medication be sent in. Does not want the cough medication that had codine in it. Please advise. [id number] [address], [place]

## 2018-11-19 NOTE — Telephone Encounter (Signed)
Copied from [place] [id number]. Topic: Compliment - Staff >> Nov 19, 2018  2:29 PM Selinda Flavin B, NT wrote: **Please forward to Rocky Crafts**  Date of encounter: 11/18/2018 Details of compliment: Patient calling and states that she would like for the staff to know how grateful she is with the care she received on 11/18/2018. States that the front desk receptionist was so kind. Everyone that she came in contact with yesterday seemed very concerned about her. Also mentions the nurse triage calling her back and was also kind and concerned. Wanted everyone to know that she felt very valued and cared for when she did not feel her best.  Who would the patient like to thank/see rewarded? Staff at office and triage nurse On a scale of 1-10, how was your experience?   Route to Engineer, building services.

## 2018-11-20 LAB — CULTURE, UPPER RESPIRATORY
MICRO NUMBER: 128412
SPECIMEN QUALITY: ADEQUATE

## 2018-11-23 ENCOUNTER — Telehealth: Payer: Self-pay

## 2018-11-23 NOTE — Telephone Encounter (Signed)
Sent Cheers to Staff who patient cam I contact with on 11/18/2018. Way to Go Team!

## 2018-11-23 NOTE — Telephone Encounter (Signed)
Copied from [place] [id number]. Topic: Compliment - Staff >> Nov 19, 2018  2:29 PM Selinda Flavin B, NT wrote: **Please forward to Rocky Crafts**  Date of encounter: 11/18/2018 Details of compliment: Patient calling and states that she would like for the staff to know how grateful she is with the care she received on 11/18/2018. States that the front desk receptionist was so kind. Everyone that she came in contact with yesterday seemed very concerned about her. Also mentions the nurse triage calling her back and was also kind and concerned. Wanted everyone to know that she felt very valued and cared for when she did not feel her best.  Who would the patient like to thank/see rewarded? Staff at office and triage nurse On a scale of 1-10, how was your experience?   Route to Engineer, building services.

## 2019-06-29 ENCOUNTER — Encounter: Payer: Self-pay | Admitting: Internal Medicine

## 2019-06-29 ENCOUNTER — Ambulatory Visit (INDEPENDENT_AMBULATORY_CARE_PROVIDER_SITE_OTHER): Payer: BC Managed Care – PPO | Admitting: Internal Medicine

## 2019-06-29 ENCOUNTER — Ambulatory Visit: Payer: Self-pay

## 2019-06-29 ENCOUNTER — Other Ambulatory Visit: Payer: Self-pay

## 2019-06-29 VITALS — Ht 61.0 in | Wt 145.0 lb

## 2019-06-29 DIAGNOSIS — K52832 Lymphocytic colitis: Secondary | ICD-10-CM | POA: Diagnosis not present

## 2019-06-29 DIAGNOSIS — M05742 Rheumatoid arthritis with rheumatoid factor of left hand without organ or systems involvement: Secondary | ICD-10-CM

## 2019-06-29 DIAGNOSIS — M05741 Rheumatoid arthritis with rheumatoid factor of right hand without organ or systems involvement: Secondary | ICD-10-CM | POA: Diagnosis not present

## 2019-06-29 DIAGNOSIS — W57XXXA Bitten or stung by nonvenomous insect and other nonvenomous arthropods, initial encounter: Secondary | ICD-10-CM | POA: Insufficient documentation

## 2019-06-29 DIAGNOSIS — S0086XA Insect bite (nonvenomous) of other part of head, initial encounter: Secondary | ICD-10-CM

## 2019-06-29 MED ORDER — PREDNISONE 10 MG PO TABS: ORAL_TABLET | ORAL | 0 refills | Status: DC

## 2019-06-29 MED ORDER — CEPHALEXIN 500 MG PO CAPS: 500.0000 mg | ORAL_CAPSULE | Freq: Four times a day (QID) | ORAL | 0 refills | Status: DC

## 2019-06-29 NOTE — Telephone Encounter (Signed)
Pt called stating that she was mowing her lawn and was bit by an insect on Monday. She states she does not know what bit her. She states that it has caused swelling heat and redness to her forehead and now it has come down the bridge of her nose and under her Rt eye. She states that it is swollen a little itchy and tender to touch but not painful. It is red and warm feeling. She has used ice and benadryl for symptom management. Care advice read to patient. Patient verbalized understanding. Call transferred to office for appointment scheduling.  Reason for Disposition . [1] Red or very tender (to touch) area AND [2] getting larger over 48 hours after the bite  Answer Assessment - Initial Assessment Questions 1. TYPE of INSECT: "What type of insect was it?"      unsure 2. ONSET: "When did you get bitten?"      Monday 3. LOCATION: "Where is the insect bite located?"     forehead 4. REDNESS: "Is the area red or pink?" If so, ask "What size is area of redness?" (inches or cm). "When did the redness start?"     Yes forehead to bridge of nose to under her eye 5. PAIN: "Is there any pain?" If so, ask: "How bad is it?"  (Scale 1-10; or mild, moderate, severe)     1-3 6. ITCHING: "Does it itch?" If so, ask: "How bad is the itch?"    - MILD: doesn't interfere with normal activities   - MODERATE-SEVERE: interferes with work, school, sleep, or other activities      Forehead at hairline  7. SWELLING: "How big is the swelling?" (inches, cm, or compare to coins)     Eyebrow to hairline to cheek bone right side 8. OTHER SYMPTOMS: "Do you have any other symptoms?"  (e.g., difficulty breathing, hives)    none 9. PREGNANCY: "Is there any chance you are pregnant?" "When was your last menstrual period?"     no  Protocols used: INSECT BITE-A-AH

## 2019-06-29 NOTE — Progress Notes (Signed)
Virtual Visit via [email or web address]  This visit type was conducted due to national recommendations for restrictions regarding the COVID-19 pandemic (e.g. social distancing).  This format is felt to be most appropriate for this patient at this time.  All issues noted in this document were discussed and addressed.  No physical exam was performed (except for noted visual exam findings with Video Visits).   I connected with@ on 06/29/19 at 11:00 AM EDT by a video enabled telemedicine application and verified that I am speaking with the correct person using two identifiers. Location patient: home Location provider: work or home office Persons participating in the virtual visit: patient, provider  I discussed the limitations, risks, security and privacy concerns of performing an evaluation and management service by telephone and the availability of in person appointments. I also discussed with the patient that there may be a patient responsible charge related to this service. The patient expressed understanding and agreed to proceed.  Reason for visit: insect bite to forehead with localized reaction   HPI:  53 yr old female on chronic immunosuppression for management of RA presents with sequelae of insect bite that occurred on Monday while cutting the grass. Unidentified inset stung her at the top of forehead at the hairline on the right,  And later on that night noted intense swelling of forehead along with a tender  knot behind her  right ear.   forehead became hot and red and swelled down to the eyebrow..  Became pruritic as well.  Took benadryl, several doses but too sedating to continue during the day.  today swelling has migrated to bridge of nose and the peri and sub orbital area, but redness has improved.   She denies fevers, nausea tongue swelling, and shortness of breath   Other chronic issues:  Lymphocytic colitis:  Managed with gluten free diet.   Grief:  Her father Nicole [facility] ) passed away  several months ago after a long year of debilitation and weight loss secondary to ESKD, chronic  respiratory failure and AMI.  Her mother is doing well.    ROS: Patient denies headache, fevers, malaise, unintentional weight loss,  eye pain, sinus congestion and sinus pain, sore throat, dysphagia,  hemoptysis , cough, dyspnea, wheezing, chest pain, palpitations, orthopnea, edema, abdominal pain, nausea, melena, diarrhea, constipation, flank pain, dysuria, hematuria, urinary  Frequency, nocturia, numbness, tingling, seizures,  Focal weakness, Loss of consciousness,  Tremor, insomnia, depression, anxiety, and suicidal ideation.      Past Medical History:  Diagnosis Date  . Colitis   . Depression   . Gastritis   . Hypertension   . rheumatoid arthritis   . Urinary tract infection     Past Surgical History:  Procedure Laterality Date  . COLONOSCOPY  05/2014  . [place] and 2011  . [place]   Born with a defect  . UPPER GI ENDOSCOPY  05/2014    Family History  Problem Relation Age of Onset  . Arthritis Mother   . Hypertension Mother   . Thyroid disease Mother        Graves Disease  . Breast cancer Mother 43       breast  . Hypertension Father   . Diabetes Father        pancreatitis induced  . CAD Father   . Thyroid disease Sister        Graves Disease  . Breast cancer Sister 87  . Thyroid disease  Brother        Graves Disease  . Hypertension Maternal Grandmother   . Arthritis Paternal Grandmother     SOCIAL HX:  reports that she has never smoked. She has never used smokeless tobacco. She reports current alcohol use. She reports that she does not use drugs.   Current Outpatient Medications:  .  ENBREL SURECLICK 50 MG/ML injection, Pt takes the 50mg /mL once a week, by injection, Disp: , Rfl:  .  cephALEXin (KEFLEX) 500 MG capsule, Take 1 capsule (500 mg total) by mouth 4 (four) times daily., Disp: 28 capsule, Rfl: 0 .  predniSONE (DELTASONE) 10 MG  tablet, 6 tablets on Day 1 , then reduce by 1 tablet daily until gone, Disp: 21 tablet, Rfl: 0  EXAM:  VITALS per patient if applicable:  GENERAL: alert, oriented, appears well and in no acute distress  HEENT: mild swelling noted to right forehead and periorbital area. conjunctiva clear,  Full ROM of right eye no obvious abnormalities on inspection of external nose and ears  NECK: normal movements of the head and neck  LUNGS: on inspection no signs of respiratory distress, breathing rate appears normal, no obvious gross SOB, gasping or wheezing  CV: no obvious cyanosis  MS: moves all visible extremities without noticeable abnormality  PSYCH/NEURO: pleasant and cooperative, no obvious depression or anxiety, speech and thought processing grossly intact  ASSESSMENT AND PLAN:  Discussed the following assessment and plan:  Insect bite of forehead with local reaction, initial encounter  Lymphocytic colitis  Rheumatoid arthritis involving both hands with positive rheumatoid factor (HCC)  Insect bite of forehead with local reaction The localized reaction does appear to be improving based on initial description. Reassured her that the periorbital and suborbital swelling was dependent edema from the forehead.  Will treat empirically, however, for cellulitis given her IC state.  Keflex and prednisone . Change benadryl to 2nd generation antihistamine for better tolerance   Lymphocytic colitis By biopsy in prior year.  Symptoms managed with gluten free diet.   Rheumatoid arthritis ([place]) Managed with intermittent use of Enbrel, prescribed by Dr. Jefm Bryant.  CBC and hepatic function with cr were all normal by October 2019 surveillance labs done at [facility],  available through Epic portal and reviewed with patient today     I discussed the assessment and treatment plan with the patient. The patient was provided an opportunity to ask questions and all were answered. The patient agreed with the  plan and demonstrated an understanding of the instructions.   The patient was advised to call back or seek an in-person evaluation if the symptoms worsen or if the condition fails to improve as anticipated.  I provided 25 minutes of non-face-to-face time during this encounter  counselling on the above issues as well as reviewing history and prior labs with patient .    Crecencio Mc, MD

## 2019-06-30 ENCOUNTER — Encounter: Payer: Self-pay | Admitting: Internal Medicine

## 2019-06-30 NOTE — Assessment & Plan Note (Signed)
By biopsy in prior year.  Symptoms managed with gluten free diet.

## 2019-06-30 NOTE — Assessment & Plan Note (Addendum)
The localized reaction does appear to be improving based on initial description. Reassured her that the periorbital and suborbital swelling was dependent edema from the forehead.  Will treat empirically, however, for cellulitis given her IC state.  Keflex and prednisone . Change benadryl to 2nd generation antihistamine for better tolerance

## 2019-06-30 NOTE — Assessment & Plan Note (Addendum)
Managed with intermittent use of Enbrel, prescribed by Dr. Jefm Bryant.  CBC and hepatic function with cr were all normal by October 2019 surveillance labs done at [facility],  available through Epic portal and reviewed with patient today

## 2019-08-02 LAB — HM MAMMOGRAPHY

## 2019-08-08 LAB — HM PAP SMEAR: HM Pap smear: NORMAL

## 2019-08-17 ENCOUNTER — Other Ambulatory Visit: Payer: Self-pay

## 2019-08-17 ENCOUNTER — Ambulatory Visit (INDEPENDENT_AMBULATORY_CARE_PROVIDER_SITE_OTHER): Payer: BC Managed Care – PPO | Admitting: Internal Medicine

## 2019-08-17 ENCOUNTER — Encounter: Payer: Self-pay | Admitting: Internal Medicine

## 2019-08-17 VITALS — BP 124/68 | Ht 61.0 in | Wt 145.0 lb

## 2019-08-17 DIAGNOSIS — R102 Pelvic and perineal pain: Secondary | ICD-10-CM

## 2019-08-17 DIAGNOSIS — N3289 Other specified disorders of bladder: Secondary | ICD-10-CM | POA: Diagnosis not present

## 2019-08-17 DIAGNOSIS — M545 Low back pain, unspecified: Secondary | ICD-10-CM

## 2019-08-17 DIAGNOSIS — E210 Primary hyperparathyroidism: Secondary | ICD-10-CM | POA: Insufficient documentation

## 2019-08-17 DIAGNOSIS — Z803 Family history of malignant neoplasm of breast: Secondary | ICD-10-CM

## 2019-08-17 NOTE — Assessment & Plan Note (Signed)
Repeat PAP smears have been normal for the past ten years.

## 2019-08-17 NOTE — Assessment & Plan Note (Signed)
She has no interest in genetic testing at this time. Will Continue annual 3d mammograms at [place]

## 2019-08-17 NOTE — Progress Notes (Signed)
Virtual Visit via [email or web address]  This visit type was conducted due to national recommendations for restrictions regarding the COVID-19 pandemic (e.g. social distancing).  This format is felt to be most appropriate for this patient at this time.  All issues noted in this document were discussed and addressed.  No physical exam was performed (except for noted visual exam findings with Video Visits).   I connected with@ on 08/17/19 at  9:30 AM EDT by a video enabled telemedicine application  and verified that I am speaking with the correct person using two identifiers. Location patient: home Location provider: work or home office Persons participating in the virtual visit: patient, provider  I discussed the limitations, risks, security and privacy concerns of performing an evaluation and management service by telephone and the availability of in person appointments. I also discussed with the patient that there may be a patient responsible charge related to this service. The patient expressed understanding and agreed to proceed.    Reason for visit: multiple symptoms and concerns including Hypercalcemia   HPI:  Recent fall backward with severe pain to lower right back in the SI joint  that lasted for several weeks and has been treated by DrCecil (chiropractor) without x rays .  Recently noted to have elevated calcium, elevated PTH.  Remote history of renal calculi , no prior DEXA scan    Family history of BRCA.  Doesn't want genetic testing or MRI breasts.   orROS: See pertinent positives and negatives per HPI.  Past Medical History:  Diagnosis Date  . Colitis   . Depression   . Gastritis   . Hypertension   . rheumatoid arthritis   . Urinary tract infection     Past Surgical History:  Procedure Laterality Date  . COLONOSCOPY  05/2014  . [place] and 2011  . [place]   Born with a defect  . UPPER GI ENDOSCOPY  05/2014    Family History  Problem Relation Age  of Onset  . Arthritis Mother   . Hypertension Mother   . Thyroid disease Mother        Graves Disease  . Breast cancer Mother 47       breast  . Hypertension Father   . Diabetes Father        pancreatitis induced  . CAD Father   . Thyroid disease Sister        Graves Disease  . Breast cancer Sister 62  . Thyroid disease Brother        Graves Disease  . Hypertension Maternal Grandmother   . Arthritis Paternal Grandmother     SOCIAL HX:  reports that she has never smoked. She has never used smokeless tobacco. She reports current alcohol use. She reports that she does not use drugs.   Current Outpatient Medications:  .  ENBREL SURECLICK 50 MG/ML injection, Pt takes the 11m/mL once a week, by injection, Disp: , Rfl:   EXAM:  VITALS per patient if applicable:  GENERAL: alert, oriented, appears well and in no acute distress  HEENT: atraumatic, conjunttiva clear, no obvious abnormalities on inspection of external nose and ears  NECK: normal movements of the head and neck  LUNGS: on inspection no signs of respiratory distress, breathing rate appears normal, no obvious gross SOB, gasping or wheezing  CV: no obvious cyanosis  MS: moves all visible extremities without noticeable abnormality  PSYCH/NEURO: pleasant and cooperative, no obvious depression or anxiety, speech and  thought processing grossly intact  ASSESSMENT AND PLAN:  Discussed the following assessment and plan:  Bladder spasm - Plan: Urinalysis, Routine w reflex microscopic, Urine Culture, CANCELED: Urine Culture  Primary hyperparathyroidism ([place]) - Plan: DG Bone Density, Ambulatory referral to Endocrinology  Acute right-sided low back pain without sciatica - Plan: DG Lumbar Spine Complete, DG Hip Unilat W OR W/O Pelvis 2-3 Views Right, Urine Culture  Pelvic pain with negative beta-human chorionic gonadotropin (BhCG) in female - Plan: Urine Culture  Family history of breast cancer in first degree  relative  Primary hyperparathyroidism ([place]) Ordering UA,  X rays of lumbar spine and hip and DEXA scan prior to endocrine referral   Family history of breast cancer in first degree relative She has no interest in genetic testing at this time. Will Continue annual 3d mammograms at [facility]   History of abnormal Pap smear Repeat PAP smears have been normal for the past ten years.      I discussed the assessment and treatment plan with the patient. The patient was provided an opportunity to ask questions and all were answered. The patient agreed with the plan and demonstrated an understanding of the instructions.   The patient was advised to call back or seek an in-person evaluation if the symptoms worsen or if the condition fails to improve as anticipated.  I provided  40 minutes of non-face-to-face time during this encounter reviewing patient's current problems and past procedures, labs and procedures ,  Providing counseling on the above mentioned problems , and coordination  of care . Crecencio Mc, MD

## 2019-08-17 NOTE — Assessment & Plan Note (Signed)
Ordering UA,  X rays of lumbar spine and hip and DEXA scan prior to endocrine referral

## 2019-08-18 ENCOUNTER — Telehealth: Payer: Self-pay

## 2019-08-18 ENCOUNTER — Other Ambulatory Visit: Payer: BC Managed Care – PPO

## 2019-08-18 NOTE — Telephone Encounter (Signed)
Spoke with pt and she stated that she found out today that she was exposed to someone who tested positive for covid on Monday. The pt was in the office with the person that tested positive on Monday. Pt will not be allowed in the office for a month. I advised pt that she could go to [facility] to have both the labs and the xrays done. Pt stated that she would wait until next month to come in. Also advised pt according to new CDC guidelines that pt should be tested as well. Pt stated that she is not going to go get tested.

## 2019-08-19 LAB — URINE CULTURE
MICRO NUMBER:: 1039970
SPECIMEN QUALITY:: ADEQUATE

## 2019-08-25 ENCOUNTER — Telehealth: Payer: Self-pay

## 2019-08-25 NOTE — Telephone Encounter (Signed)
Copied from [place] [id number]. Topic: General - Inquiry >> Aug 25, 2019 12:09 PM Alease Frame wrote: Reason for CRM: Patient received a e-mail saying to check her test results which patient feels it was done in error.  Please advise

## 2019-08-25 NOTE — Telephone Encounter (Signed)
Advise urine culture results.

## 2019-08-26 ENCOUNTER — Encounter: Payer: Self-pay | Admitting: Internal Medicine

## 2019-09-02 ENCOUNTER — Encounter: Payer: Self-pay | Admitting: Internal Medicine

## 2019-09-02 ENCOUNTER — Other Ambulatory Visit: Payer: Self-pay

## 2019-09-02 ENCOUNTER — Ambulatory Visit (INDEPENDENT_AMBULATORY_CARE_PROVIDER_SITE_OTHER): Payer: BC Managed Care – PPO | Admitting: Internal Medicine

## 2019-09-02 VITALS — Ht 61.0 in | Wt 145.0 lb

## 2019-09-02 DIAGNOSIS — R6884 Jaw pain: Secondary | ICD-10-CM | POA: Diagnosis not present

## 2019-09-02 NOTE — Progress Notes (Addendum)
Virtual Visit via [email or web address]  This visit type was conducted due to national recommendations for restrictions regarding the COVID-19 pandemic (e.g. social distancing).  This format is felt to be most appropriate for this patient at this time.  All issues noted in this document were discussed and addressed.  No physical exam was performed (except for noted visual exam findings with Video Visits).   I connected with@ on 09/02/19 at  9:00 AM EST by a video enabled telemedicine application  and verified that I am speaking with the correct person using two identifiers. Location patient: home Location provider: work or home office Persons participating in the virtual visit: patient, provider  I discussed the limitations, risks, security and privacy concerns of performing an evaluation and management service by telephone and the availability of in person appointments. I also discussed with the patient that there may be a patient responsible charge related to this service. The patient expressed understanding and agreed to proceed.  Reason for visit: left sided facial /ear pain   HPI:  53 yr old female with RA managed with  Enbrel presents with recent episode of pain that started behind her left ear ,  radiated to left jaw and left cheek. Had been chewing on hard candy prior to onset.  Pain also radiated to left neck and was  aggravated by lying supine  But relieved by sitting up.  No fevers,  Headache ,  Vision changes,  sore throat, or body aches.   Was referred to orthodontist by dentist ,  Jaw films ruled out abscess,  Gums were fine.  Saw ENT .  Ear and throat were fine,  No LAD. However she was empirically prescribed 10 days amoxicillin.  By day 2 symptoms had improved and  She is currently pain free, but is worried that the pain will return as she does not have a diagnosis   History of DDD involving the cervical spine,  Has been receiving chiropractic manipulation by chiropractor    ROS: See pertinent  positives and negatives per HPI.  Past Medical History:  Diagnosis Date  . Colitis   . Depression   . Gastritis   . Hypertension   . rheumatoid arthritis   . Rheumatoid arthritis ([place])   . Urinary tract infection     Past Surgical History:  Procedure Laterality Date  . COLONOSCOPY  05/2014  . [place] and 2011  . [place]   Born with a defect  . UPPER GI ENDOSCOPY  05/2014    Family History  Problem Relation Age of Onset  . Arthritis Mother   . Hypertension Mother   . Thyroid disease Mother        Graves Disease  . Breast cancer Mother 65       breast  . Hypertension Father   . Diabetes Father        pancreatitis induced  . CAD Father   . Thyroid disease Sister        Graves Disease  . Breast cancer Sister 71  . Thyroid disease Brother        Graves Disease  . Hypertension Maternal Grandmother   . Arthritis Paternal Grandmother     SOCIAL HX: reports that she has never smoked. She has never used smokeless tobacco. She reports previous alcohol use. She reports that she does not use drugs.  Current Outpatient Medications:  .  amoxicillin-clavulanate (AUGMENTIN) 875-125 MG tablet, Take 1 tablet by mouth  2 (two) times daily., Disp: , Rfl:  .  ENBREL SURECLICK 50 MG/ML injection, Pt takes the 72m/mL once a week, by injection, Disp: , Rfl:  .  methylPREDNISolone (MEDROL) 4 MG TBPK tablet, Dispense Medrol Dosepak as directed, Disp: 21 tablet, Rfl: 0  EXAM:  VITALS per patient if applicable:  GENERAL: alert, oriented, appears well and in no acute distress  HEENT: atraumatic, conjunttiva clear, no obvious abnormalities on inspection of external nose and ears  NECK: normal movements of the head and neck  LUNGS: on inspection no signs of respiratory distress, breathing rate appears normal, no obvious gross SOB, gasping or wheezing  CV: no obvious cyanosis  MS: moves all visible extremities without noticeable abnormality  PSYCH/NEURO:  pleasant and cooperative, no obvious depression or anxiety, speech and thought processing grossly intact  ASSESSMENT AND PLAN:  Discussed the following assessment and plan:  Jaw pain - Plan: Sedimentation rate, C-reactive protein, CBC with Differential/Platelet, Comprehensive metabolic panel  Jaw pain Etiology may be multifactorial: TMJ complicated by otitis media which resolved with amoxicillin.  Advised patient to complete the antibiotic regimen and continue to avoid behaviors that would aggravated TMJ.  If symptoms return will assess for arteritis with ESR./CRP/CBC and if negative consider trigeminal neuralgia /neurology referral     I discussed the assessment and treatment plan with the patient. The patient was provided an opportunity to ask questions and all were answered. The patient agreed with the plan and demonstrated an understanding of the instructions.   The patient was advised to call back or seek an in-person evaluation if the symptoms worsen or if the condition fails to improve as anticipated.  I provided  40 minutes of non-face-to-face time during this encounter reviewing patient's current problems and post surgeries.  Providing counseling on the above mentioned problems , and coordination  of care .  TCrecencio Mc MD

## 2019-09-04 ENCOUNTER — Emergency Department: Payer: BC Managed Care – PPO

## 2019-09-04 ENCOUNTER — Encounter: Payer: Self-pay | Admitting: *Deleted

## 2019-09-04 ENCOUNTER — Emergency Department
Admission: EM | Admit: 2019-09-04 | Discharge: 2019-09-04 | Disposition: A | Discharge status: Home / Self Care | Payer: BC Managed Care – PPO | Attending: Emergency Medicine | Admitting: Emergency Medicine

## 2019-09-04 ENCOUNTER — Other Ambulatory Visit: Payer: Self-pay

## 2019-09-04 DIAGNOSIS — R6884 Jaw pain: Secondary | ICD-10-CM | POA: Insufficient documentation

## 2019-09-04 DIAGNOSIS — M542 Cervicalgia: Secondary | ICD-10-CM | POA: Insufficient documentation

## 2019-09-04 DIAGNOSIS — M5090 Cervical disc disorder, unspecified, unspecified cervical region: Secondary | ICD-10-CM

## 2019-09-04 DIAGNOSIS — D351 Benign neoplasm of parathyroid gland: Secondary | ICD-10-CM

## 2019-09-04 DIAGNOSIS — I10 Essential (primary) hypertension: Secondary | ICD-10-CM | POA: Insufficient documentation

## 2019-09-04 HISTORY — DX: Rheumatoid arthritis, unspecified: M06.9

## 2019-09-04 LAB — BASIC METABOLIC PANEL
Anion gap: 8 (ref 5–15)
BUN: 15 mg/dL (ref 6–20)
CO2: 28 mmol/L (ref 22–32)
Calcium: 9.5 mg/dL (ref 8.9–10.3)
Chloride: 104 mmol/L (ref 98–111)
Creatinine, Ser: 0.63 mg/dL (ref 0.44–1.00)
GFR calc Af Amer: >60 mL/min (ref >60)
GFR calc non Af Amer: >60 mL/min (ref >60)
Glucose, Bld: 130 mg/dL — ABNORMAL HIGH (ref 70–99)
Potassium: 4.1 mmol/L (ref 3.5–5.1)
Sodium: 140 mmol/L (ref 135–145)

## 2019-09-04 LAB — CBC
HCT: 42.7 % (ref 36.0–46.0)
Hemoglobin: 13.6 g/dL (ref 12.0–15.0)
MCH: 28.2 pg (ref 26.0–34.0)
MCHC: 31.9 g/dL (ref 30.0–36.0)
MCV: 88.4 fL (ref 80.0–100.0)
Platelets: 324 10*3/uL (ref 150–400)
RBC: 4.83 MIL/uL (ref 3.87–5.11)
RDW: 13.1 % (ref 11.5–15.5)
WBC: 14.8 10*3/uL — ABNORMAL HIGH (ref 4.0–10.5)
nRBC: 0 % (ref 0.0–0.2)

## 2019-09-04 LAB — TROPONIN I (HIGH SENSITIVITY)
Troponin I (High Sensitivity): 4 ng/L (ref <18)
Troponin I (High Sensitivity): 3 ng/L (ref <18)

## 2019-09-04 MED ORDER — SODIUM CHLORIDE 0.9 % IV SOLN
Freq: Once | INTRAVENOUS | Status: AC
  Administered 2019-09-04: 08:00:00 via INTRAVENOUS

## 2019-09-04 MED ORDER — METHYLPREDNISOLONE 4 MG PO TBPK: ORAL_TABLET | ORAL | 0 refills | Status: DC

## 2019-09-04 MED ORDER — IOHEXOL 350 MG/ML SOLN
75.0000 mL | Freq: Once | INTRAVENOUS | Status: AC | PRN
  Administered 2019-09-04: 12:00:00 75 mL via INTRAVENOUS

## 2019-09-04 MED ORDER — ONDANSETRON HCL 4 MG/2ML IJ SOLN
4.0000 mg | Freq: Once | INTRAMUSCULAR | Status: AC
  Administered 2019-09-04: 4 mg via INTRAVENOUS
  Filled 2019-09-04: qty 2

## 2019-09-04 MED ORDER — GADOBUTROL 1 MMOL/ML IV SOLN
7.0000 mL | Freq: Once | INTRAVENOUS | Status: AC | PRN
  Administered 2019-09-04: 7 mL via INTRAVENOUS

## 2019-09-04 NOTE — ED Provider Notes (Addendum)
[facility] Emergency Department Provider Note       Time seen: ----------------------------------------- 6:56 AM on 09/04/2019 -----------------------------------------   I have reviewed the triage vital signs and the nursing notes.  HISTORY   Chief Complaint Jaw Pain and Nausea   HPI Sherry Maxwell is a 53 y.o. female with a history of colitis, depression, gastritis, hypertension, rheumatoid arthritis who presents to the ED for left jaw pain that begins behind the ear and radiates down the neck and into her chest.  Neck and jaw pain seem to start after spinal manipulation by chiropractic medicine.  She denies any chest pain currently.  Does have nausea.  She syncopized at home today, has not done that before.  She has been seen by ENT and her primary care doctor with no specific etiology being given.  She has been given some antibiotics for possible dental infection.  Past Medical History:  Diagnosis Date  . Colitis   . Depression   . Gastritis   . Hypertension   . rheumatoid arthritis   . Rheumatoid arthritis ([place])   . Urinary tract infection     Patient Active Problem List   Diagnosis Date Noted  . Primary hyperparathyroidism ([place]) 08/17/2019  . Genetic testing 02/16/2017  . Family history of breast cancer in first degree relative 02/03/2017  . Lymphocytic colitis 08/10/2014  . Gastritis and gastroduodenitis 03/10/2014  . Menopause 08/17/2013  . History of abnormal Pap smear 08/06/2013  . Depression   . Rheumatoid arthritis ([place]) 12/23/2012    Past Surgical History:  Procedure Laterality Date  . COLONOSCOPY  05/2014  . [place] and 2011  . [place]   Born with a defect  . UPPER GI ENDOSCOPY  05/2014    Allergies Sulfa antibiotics  Social History Social History   Tobacco Use  . Smoking status: Never Smoker  . Smokeless tobacco: Never Used  Substance Use Topics  . Alcohol use: Not Currently  . Drug  use: No    Review of Systems Constitutional: Negative for fever. Cardiovascular: Positive for chest pain Respiratory: Negative for shortness of breath. Gastrointestinal: Negative for abdominal pain, vomiting and diarrhea. Musculoskeletal: Positive for neck pain Skin: Negative for rash. Neurological: Negative for headaches, focal weakness or numbness.  All systems negative/normal/unremarkable except as stated in the HPI  ____________________________________________   PHYSICAL EXAM:  VITAL SIGNS: ED Triage Vitals  Enc Vitals Group     BP 09/04/19 0247 128/68     Pulse Rate 09/04/19 0247 77     Resp 09/04/19 0247 18     Temp 09/04/19 0247 97.7 F (36.5 C)     Temp Source 09/04/19 0247 Oral     SpO2 09/04/19 0247 100 %     Weight 09/04/19 0247 147 lb (66.7 kg)     Height 09/04/19 0247 5\' 1"  (1.549 m)     Head Circumference --      Peak Flow --      Pain Score 09/04/19 0257 0     Pain Loc --      Pain Edu? --      Excl. in [place]? --     Constitutional: Alert and oriented. Well appearing and in no distress. Eyes: Conjunctivae are normal. Normal extraocular movements. ENT      Head: Normocephalic and atraumatic.      Nose: No congestion/rhinnorhea.      Mouth/Throat: Mucous membranes are moist.  Neck: No stridor. Cardiovascular: Normal rate, regular rhythm. No murmurs, rubs, or gallops. Respiratory: Normal respiratory effort without tachypnea nor retractions. Breath sounds are clear and equal bilaterally. No wheezes/rales/rhonchi. Gastrointestinal: Soft and nontender. Normal bowel sounds Musculoskeletal: Nontender with normal range of motion in extremities. No lower extremity tenderness nor edema. Neurologic:  Normal speech and language. No gross focal neurologic deficits are appreciated.  Skin:  Skin is warm, dry and intact. No rash noted. Psychiatric: Mood and affect are normal. Speech and behavior are normal.  ____________________________________________  EKG:  Interpreted by me.  Sinus rhythm with a rate of 88 bpm, normal PR interval, normal QRS, normal QT  ____________________________________________  ED COURSE:  As part of my medical decision making, I reviewed the following data within the [place] History obtained from family if available, nursing notes, old chart and ekg, as well as notes from prior ED visits. Patient presented for ongoing left-sided neck pain and syncope, we will assess with labs and imaging as indicated at this time.   Procedures  Sherry Maxwell was evaluated in Emergency Department on 09/04/2019 for the symptoms described in the history of present illness. She was evaluated in the context of the global COVID-19 pandemic, which necessitated consideration that the patient might be at risk for infection with the SARS-CoV-2 virus that causes COVID-19. Institutional protocols and algorithms that pertain to the evaluation of patients at risk for COVID-19 are in a state of rapid change based on information released by regulatory bodies including the CDC and federal and state organizations. These policies and algorithms were followed during the patient's care in the ED.  ____________________________________________   LABS (pertinent positives/negatives)  Labs Reviewed  BASIC METABOLIC PANEL - Abnormal; Notable for the following components:      Result Value   Glucose, Bld 130 (*)    All other components within normal limits  CBC - Abnormal; Notable for the following components:   WBC 14.8 (*)    All other components within normal limits  PARATHYROID HORMONE, INTACT (NO CA)  POC URINE PREG, ED  TROPONIN I (HIGH SENSITIVITY)  TROPONIN I (HIGH SENSITIVITY)    RADIOLOGY Images were viewed by me Chest x-ray was unremarkable  CT maxillofacial Was unremarkable  MRI cervical spine Disc levels:   C1-2: Mild degenerative change. No significant pannus posterior to  the dens. No significant spinal stenosis    C2-3: Negative   C3-4: Moderate facet degeneration on the right. No significant  foraminal or spinal stenosis.   C4-5: Central disc protrusion with cord flattening and mild spinal  stenosis. Right-sided facet hypertrophy. No significant foraminal  stenosis.   C5-6: Disc degeneration and spondylosis. Disc degeneration and  spurring left greater than right. Mild spinal stenosis. Moderate  left foraminal stenosis and mild right foraminal stenosis due to  spurring   C6-7: Disc degeneration and spondylosis. Mild foraminal narrowing  bilaterally due to spurring   C7-T1: Negative   IMPRESSION:  Multilevel spondylosis in the cervical spine causing spinal and  foraminal stenosis as above.   No significant pannus at C1-2 to suggest rheumatoid arthritis or  instability.   Enhancing nodule to the right of the esophagus at the C7 level.  While this could represent a lymph node, its bright T2  characteristics and location suggest parathyroid adenoma. Correlate  with serum calcium and parathyroid hormone levels.  ____________________________________________   DIFFERENTIAL DIAGNOSIS   Arthritis, degenerative disc disease, disc herniation, trigeminal neuralgia, tumor  FINAL ASSESSMENT AND PLAN  Left-sided jaw pain, cervical disc disease   Plan: The patient had presented for left-sided jaw pain and neck pain that radiates into her chest. Patient's labs were reassuring. Patient's imaging did reveal multilevel cervical disc disease and also a parathyroid adenoma.  Serum calcium levels are normal.  Have sent off for a parathyroid hormone level.  Serial troponins were negative.  I have discussed the MRI with a neurologist.  CT angiogram was recommended to rule out dissection which is negative.  I will prescribe steroids to see if this helps.  She is cleared for outpatient follow-up.   Laurence Aly, MD    Note: This note was generated in part or whole with voice recognition  software. Voice recognition is usually quite accurate but there are transcription errors that can and very often do occur. I apologize for any typographical errors that were not detected and corrected.     Earleen Newport, MD 09/04/19 1121    Earleen Newport, MD 09/04/19 [phone]

## 2019-09-04 NOTE — Consult Note (Signed)
Reason for Consult:jaw and neck pain  Referring Physician: Dr. Jimmye Norman   CC: jaw and neck pain   HPI: Sherry Maxwell is an 53 y.o. female with chronic RA presents with pain behind L ear that has been going on for few month presents with neck pain that radiates to the jaw. Recent hx of multiple neck manipulations by chiropractor. Pain improved. Possible syncopal event while baring down yesterday.     Past Medical History:  Diagnosis Date  . Colitis   . Depression   . Gastritis   . Hypertension   . rheumatoid arthritis   . Rheumatoid arthritis ([place])   . Urinary tract infection     Past Surgical History:  Procedure Laterality Date  . COLONOSCOPY  05/2014  . [place] and 2011  . [place]   Born with a defect  . UPPER GI ENDOSCOPY  05/2014    Family History  Problem Relation Age of Onset  . Arthritis Mother   . Hypertension Mother   . Thyroid disease Mother        Graves Disease  . Breast cancer Mother 70       breast  . Hypertension Father   . Diabetes Father        pancreatitis induced  . CAD Father   . Thyroid disease Sister        Graves Disease  . Breast cancer Sister 5  . Thyroid disease Brother        Graves Disease  . Hypertension Maternal Grandmother   . Arthritis Paternal Grandmother     Social History:  reports that she has never smoked. She has never used smokeless tobacco. She reports previous alcohol use. She reports that she does not use drugs.  Allergies  Allergen Reactions  . Sulfa Antibiotics Swelling and Rash    Medications: I have reviewed the patient's current medications.  ROS: History obtained from the patient  General ROS: negative for - chills, fatigue, fever, night sweats, weight gain or weight loss Psychological ROS: negative for - behavioral disorder, hallucinations, memory difficulties, mood swings or suicidal ideation Ophthalmic ROS: negative for - blurry vision, double vision, eye pain or loss of  vision ENT ROS: negative for - epistaxis, nasal discharge, oral lesions, sore throat, tinnitus or vertigo Allergy and Immunology ROS: negative for - hives or itchy/watery eyes Hematological and Lymphatic ROS: negative for - bleeding problems, bruising or swollen lymph nodes Endocrine ROS: negative for - galactorrhea, hair pattern changes, polydipsia/polyuria or temperature intolerance Respiratory ROS: negative for - cough, hemoptysis, shortness of breath or wheezing Cardiovascular ROS: negative for - chest pain, dyspnea on exertion, edema or irregular heartbeat Gastrointestinal ROS: negative for - abdominal pain, diarrhea, hematemesis, nausea/vomiting or stool incontinence Genito-Urinary ROS: negative for - dysuria, hematuria, incontinence or urinary frequency/urgency Musculoskeletal ROS: negative for - joint swelling or muscular weakness Neurological ROS: as noted in HPI Dermatological ROS: negative for rash and skin lesion changes  Physical Examination: Blood pressure (!) 154/78, pulse (!) 106, temperature 97.7 F (36.5 C), temperature source Oral, resp. rate (!) 24, height 5' 1"  (1.549 m), weight 66.7 kg, SpO2 100 %.   Neurological Examination   Mental Status: Alert, oriented, thought content appropriate.  Speech fluent without evidence of aphasia.  Able to follow 3 step commands without difficulty. Cranial Nerves: II: Discs flat bilaterally; Visual fields grossly normal, pupils equal, round, reactive to light and accommodation III,IV, VI: ptosis not present, extra-ocular motions  intact bilaterally V,VII: smile symmetric, facial light touch sensation normal bilaterally VIII: hearing normal bilaterally IX,X: gag reflex present XI: bilateral shoulder shrug XII: midline tongue extension Motor: Right : Upper extremity   5/5    Left:     Upper extremity   5/5  Lower extremity   5/5     Lower extremity   5/5 Tone and bulk:normal tone throughout; no atrophy noted Sensory: Pinprick and  light touch intact throughout, bilaterally Deep Tendon Reflexes: 2+ and symmetric throughout Plantars: Right: downgoing   Left: downgoing Cerebellar: normal finger-to-nose, normal rapid alternating movements and normal heel-to-shin test Gait: not tested       Laboratory Studies:   Basic Metabolic Panel: Recent Labs  Lab 09/04/19 0308  NA 140  K 4.1  CL 104  CO2 28  GLUCOSE 130*  BUN 15  CREATININE 0.63  CALCIUM 9.5    Liver Function Tests: No results for input(s): AST, ALT, ALKPHOS, BILITOT, PROT, ALBUMIN in the last 168 hours. No results for input(s): LIPASE, AMYLASE in the last 168 hours. No results for input(s): AMMONIA in the last 168 hours.  CBC: Recent Labs  Lab 09/04/19 0308  WBC 14.8*  HGB 13.6  HCT 42.7  MCV 88.4  PLT 324    Cardiac Enzymes: No results for input(s): CKTOTAL, CKMB, CKMBINDEX, TROPONINI in the last 168 hours.  BNP: Invalid input(s): POCBNP  CBG: No results for input(s): GLUCAP in the last 168 hours.  Microbiology: Results for orders placed or performed in visit on 08/17/19  Urine Culture     Status: None   Collection Time: 08/17/19 10:31 AM  Result Value Ref Range Status   MICRO NUMBER: [id number]  Final   SPECIMEN QUALITY: Adequate  Final   Sample Source NOT GIVEN  Final   STATUS: FINAL  Final   ISOLATE 1:   Final    Less than 10,000 CFU/mL of single Gram positive organism isolated. No further testing will be performed. If clinically indicated, recollection using a method to minimize contamination, with prompt transfer to Urine Culture Transport Tube, is recommended.    Coagulation Studies: No results for input(s): LABPROT, INR in the last 72 hours.  Urinalysis: No results for input(s): COLORURINE, LABSPEC, PHURINE, GLUCOSEU, HGBUR, BILIRUBINUR, KETONESUR, PROTEINUR, UROBILINOGEN, NITRITE, LEUKOCYTESUR in the last 168 hours.  Invalid input(s): APPERANCEUR  Lipid Panel:     Component Value Date/Time   CHOL 187 02/02/2017  1555   TRIG 50.0 02/02/2017 1555   HDL 65.50 02/02/2017 1555   CHOLHDL 3 02/02/2017 1555   VLDL 10.0 02/02/2017 1555   LDLCALC 111 (H) 02/02/2017 1555    HgbA1C: No results found for: HGBA1C  Urine Drug Screen:  No results found for: LABOPIA, COCAINSCRNUR, LABBENZ, AMPHETMU, THCU, LABBARB  Alcohol Level: No results for input(s): ETH in the last 168 hours.  Other results: EKG: normal EKG, normal sinus rhythm, unchanged from previous tracings.  Imaging: Dg Chest 2 View  Result Date: 09/04/2019 CLINICAL DATA:  Chest pain EXAM: CHEST - 2 VIEW COMPARISON:  01/09/2012 FINDINGS: Cardiac shadows within normal limits. The lungs are well aerated bilaterally. No focal infiltrate or sizable effusion is seen. No bony abnormality is noted. IMPRESSION: No acute abnormality seen. Electronically Signed   By: Inez Catalina M.D.   On: 09/04/2019 03:35   Mr Cervical Spine W Wo Contrast  Result Date: 09/04/2019 CLINICAL DATA:  Neck pain. Jaw pain. History of rheumatoid arthritis. EXAM: MRI CERVICAL SPINE WITHOUT AND WITH CONTRAST TECHNIQUE: Multiplanar and  multiecho pulse sequences of the cervical spine, to include the craniocervical junction and cervicothoracic junction, were obtained without and with intravenous contrast. CONTRAST:  36m GADAVIST GADOBUTROL 1 MMOL/ML IV SOLN COMPARISON:  None. FINDINGS: Alignment: Mild anterolisthesis C3-4.  Mild kyphosis at C4-5. Vertebrae: Negative for fracture or mass.  Normal bone marrow. Cord: Normal signal and morphology.  Normal enhancement of the cord. Posterior Fossa, vertebral arteries, paraspinal tissues: Enhancing soft tissue nodule to the right of the esophagus at the C7 level measuring approximately 6 x 9 mm. This is hyperintense on T2 and shows intense homogeneous enhancement. Disc levels: C1-2: Mild degenerative change. No significant pannus posterior to the dens. No significant spinal stenosis C2-3: Negative C3-4: Moderate facet degeneration on the right. No  significant foraminal or spinal stenosis. C4-5: Central disc protrusion with cord flattening and mild spinal stenosis. Right-sided facet hypertrophy. No significant foraminal stenosis. C5-6: Disc degeneration and spondylosis. Disc degeneration and spurring left greater than right. Mild spinal stenosis. Moderate left foraminal stenosis and mild right foraminal stenosis due to spurring C6-7: Disc degeneration and spondylosis. Mild foraminal narrowing bilaterally due to spurring C7-T1: Negative IMPRESSION: Multilevel spondylosis in the cervical spine causing spinal and foraminal stenosis as above. No significant pannus at C1-2 to suggest rheumatoid arthritis or instability. Enhancing nodule to the right of the esophagus at the C7 level. While this could represent a lymph node, its bright T2 characteristics and location suggest parathyroid adenoma. Correlate with serum calcium and parathyroid hormone levels. Electronically Signed   By: CFranchot GalloM.D.   On: 09/04/2019 10:12   Ct Maxillofacial Wo Contrast  Result Date: 09/04/2019 CLINICAL DATA:  Left jaw pain EXAM: CT MAXILLOFACIAL WITHOUT CONTRAST TECHNIQUE: Multidetector CT imaging of the maxillofacial structures was performed. Multiplanar CT image reconstructions were also generated. COMPARISON:  CT head from 02/02/2017. FINDINGS: Osseous: No fracture or mandibular dislocation. No destructive process. Orbits: Negative. No traumatic or inflammatory finding. Sinuses: Clear. Soft tissues: Negative. Limited intracranial: No significant or unexpected finding. IMPRESSION: No acute findings. Electronically Signed   By: TKerby MoorsM.D.   On: 09/04/2019 08:59     Assessment/Plan:   53y.o. female with chronic RA presents with pain behind L ear that has been going on for few month presents with neck pain that radiates to the jaw. Recent hx of multiple neck manipulations by chiropractor. Pain improved. Possible syncopal event while baring down yesterday.   -  MRI C spine reviewed some chronic changes but nothing acute - would like to see CTA neck and head to make sure no dissection - Pt is complaining of occasional temporal pain  - obtain ESR and CRP as RA sometimes associated with temporal arterities - Stress possibly contributing to her symptoms.  - possibly d/c planning later day 09/04/2019, 11:44 AM

## 2019-09-04 NOTE — ED Notes (Signed)
Patient transported to MRI 

## 2019-09-04 NOTE — ED Notes (Signed)
Pt returned from MRI °

## 2019-09-04 NOTE — ED Notes (Signed)
Unhooked pt to use the bathroom.  

## 2019-09-04 NOTE — Assessment & Plan Note (Signed)
Etiology may be multifactorial: TMJ complicated by otitis media which resolved with amoxicillin.  Advised patient to complete the antibiotic regimen and continue to avoid behaviors that would aggravated TMJ.  If symptoms return will assess for arteritis with ESR./CRP/CBC and if negative consider trigeminal neuralgia /neurology referral

## 2019-09-04 NOTE — ED Notes (Signed)
Patient transported to CT 

## 2019-09-04 NOTE — ED Notes (Signed)
Consulting physician at bedside.

## 2019-09-04 NOTE — ED Notes (Signed)
Pt still at MRI

## 2019-09-04 NOTE — ED Triage Notes (Addendum)
Per EMS report, patient c/o left jaw pain that begins behind the ear and radiates now the neck to the chest. Patient denies chest pain at this time, but c/o nausea. EMS reports patient was pale and nauseous upon their arrival. Patient states pain woke her up. Patient is currently on antibiotics for possible TMJ. Patient has c/o left neck/jaw pain for several weeks and has followed up with various specialities. Patient is dry heaving in triage. Patient was given 449ml of NS in the ambulance and 4mg  Zofran IV. Patient states she also had a syncopal episode tonight after she woke up, but denies any injuries.

## 2019-09-05 LAB — PARATHYROID HORMONE, INTACT (NO CA): PTH: 62 pg/mL (ref 15–65)

## 2019-09-06 ENCOUNTER — Encounter: Payer: Self-pay | Admitting: Internal Medicine

## 2019-09-09 ENCOUNTER — Ambulatory Visit (INDEPENDENT_AMBULATORY_CARE_PROVIDER_SITE_OTHER): Payer: BC Managed Care – PPO | Admitting: Internal Medicine

## 2019-09-09 ENCOUNTER — Other Ambulatory Visit: Payer: Self-pay

## 2019-09-09 ENCOUNTER — Encounter: Payer: Self-pay | Admitting: Internal Medicine

## 2019-09-09 VITALS — Ht 61.0 in | Wt 147.0 lb

## 2019-09-09 DIAGNOSIS — M50121 Cervical disc disorder at C4-C5 level with radiculopathy: Secondary | ICD-10-CM | POA: Diagnosis not present

## 2019-09-09 DIAGNOSIS — E210 Primary hyperparathyroidism: Secondary | ICD-10-CM | POA: Diagnosis not present

## 2019-09-09 NOTE — Progress Notes (Signed)
Virtual Visit via [email or web address]  This visit type was conducted due to national recommendations for restrictions regarding the COVID-19 pandemic (e.g. social distancing).  This format is felt to be most appropriate for this patient at this time.  All issues noted in this document were discussed and addressed.  No physical exam was performed (except for noted visual exam findings with Video Visits).   I connected with@ on 09/09/19 at 11:00 AM EST by a video enabled telemedicine application and verified that I am speaking with the correct person using two identifiers. Location patient: home Location provider: work or home office Persons participating in the virtual visit: patient, provider  I discussed the limitations, risks, security and privacy concerns of performing an evaluation and management service by telephone and the availability of in person appointments. I also discussed with the patient that there may be a patient responsible charge related to this service. The patient expressed understanding and agreed to proceed.  Reason for visit: ER follow up  HPI:  53 yr old female with rheumatoid arthritis on Enbrel, multiple recent visits for various acute complaints, most recently evaluated for left ear pain radiating to left jaw and neck,  Presumed to be TMJ complicated by otitis or an occult head  and neck  infection that responded  To antibiotics .  Ear pain returned last Friday night and Saturday.  On Sunday morning ,  Early ,  woke up feeling poorly, got up to have a  liquid BM ,  Came back to bed but felt very weak.  Husband helped her back to bed and she fainted .  Husband 911 called after being unresponsive  FOR 30-40 SECS AT HOME.  CPR was not done.  Woke up and started vomiting violently. Taken to ER .  Given iv fluids.   Was seen by neurology .  Abx stopped.  Given a steroid taper.   Multiple images were done due to concern for vertebral artery dissection by recent manipulation of neck by  chiropractor .  CT angiogram of head and neck were normal.   MRI cervical spine and CT maxillofacial were done.  Findings:   mild anterolisthesis of c3-4  And a soft tissue nodule  at the c7 level adjacent to the esophagus on the right side measuring  6 x 9 mm  Suggestive of  Parathyroid adenoma    c4-5 central disk protrusion with cord flattening  And mild  spinal stenosis .  Spurring at c6- 7    Calcium was normal PTH ULN AT 2    ROS: See pertinent positives and negatives per HPI.  Past Medical History:  Diagnosis Date  . Colitis   . Depression   . Gastritis   . Hypertension   . rheumatoid arthritis   . Rheumatoid arthritis ([facility])   . Urinary tract infection     Past Surgical History:  Procedure Laterality Date  . COLONOSCOPY  05/2014  . FOOT SURGERY     19 96 and 2011  . [place]   Born with a defect  . UPPER GI ENDOSCOPY  05/2014    Family History  Problem Relation Age of Onset  . Arthritis Mother   . Hypertension Mother   . Thyroid disease Mother        Graves Disease  . Breast cancer Mother 42       breast  . Hypertension Father   . Diabetes Father        pancreatitis induced  .  CAD Father   . Thyroid disease Sister        Graves Disease  . Breast cancer Sister 41  . Thyroid disease Brother        Graves Disease  . Hypertension Maternal Grandmother   . Arthritis Paternal Grandmother     SOCIAL HX:  reports that she has never smoked. She has never used smokeless tobacco. She reports previous alcohol use. She reports that she does not use drugs.   Current Outpatient Medications:  .  ENBREL SURECLICK 50 MG/ML injection, Pt takes the 50mg /mL once a week, by injection, Disp: , Rfl:  .  methylPREDNISolone (MEDROL) 4 MG TBPK tablet, Dispense Medrol Dosepak as directed, Disp: 21 tablet, Rfl: 0  EXAM:  VITALS per patient if applicable:  GENERAL: alert, oriented, appears well and in no acute distress  HEENT: atraumatic, conjunttiva clear, no  obvious abnormalities on inspection of external nose and ears  NECK: normal movements of the head and neck  LUNGS: on inspection no signs of respiratory distress, breathing rate appears normal, no obvious gross SOB, gasping or wheezing  CV: no obvious cyanosis  MS: moves all visible extremities without noticeable abnormality  PSYCH/NEURO: pleasant and cooperative, no obvious depression or anxiety, speech and thought processing grossly intact  ASSESSMENT AND PLAN:  Discussed the following assessment and plan:  Cervical disc disorder at C4-C5 level with radiculopathy - Plan: Ambulatory referral to Neurology  Primary hyperparathyroidism [facility]) - Plan: Ambulatory referral to Endocrinology  Cervical disc disorder at C4-C5 level with radiculopathy Referral to neurology for evaluation of jaw pain and coincident vs contributing c4 disk disorder with radiculopathy  Primary hyperparathyroidism ([place]) With parathyroid adenoma suggested by CT neck.  Current calcium level is normal ,  And PTH is ULN as well.  No compelling reason to have parathyroidectomy urgently,  But will continue workup  With endocrinology referral , bone density test.     I discussed the assessment and treatment plan with the patient. The patient was provided an opportunity to ask questions and all were answered. The patient agreed with the plan and demonstrated an understanding of the instructions.   The patient was advised to call back or seek an in-person evaluation if the symptoms worsen or if the condition fails to improve as anticipated.    I provided 40 minutes of non-face-to-face time during this encounter reviewing patient's current problems and post surgeries.  Providing counseling on the above mentioned problems , and coordination  of care .   Crecencio Mc, MD .

## 2019-09-11 DIAGNOSIS — M50121 Cervical disc disorder at C4-C5 level with radiculopathy: Secondary | ICD-10-CM | POA: Insufficient documentation

## 2019-09-11 NOTE — Assessment & Plan Note (Addendum)
With parathyroid adenoma suggested by CT neck.  Current calcium level is normal ,  And PTH is ULN as well.  No compelling reason to have parathyroidectomy urgently,  But will continue workup  With endocrinology referral , bone density test.

## 2019-09-11 NOTE — Assessment & Plan Note (Signed)
Referral to neurology for evaluation of jaw pain and coincident vs contributing c4 disk disorder with radiculopathy

## 2019-09-12 ENCOUNTER — Other Ambulatory Visit: Payer: Self-pay

## 2019-09-19 ENCOUNTER — Other Ambulatory Visit (INDEPENDENT_AMBULATORY_CARE_PROVIDER_SITE_OTHER): Payer: BC Managed Care – PPO

## 2019-09-19 ENCOUNTER — Other Ambulatory Visit: Payer: Self-pay

## 2019-09-19 DIAGNOSIS — R102 Pelvic and perineal pain: Secondary | ICD-10-CM

## 2019-09-19 DIAGNOSIS — M545 Low back pain, unspecified: Secondary | ICD-10-CM

## 2019-09-19 DIAGNOSIS — R6884 Jaw pain: Secondary | ICD-10-CM | POA: Diagnosis not present

## 2019-09-19 DIAGNOSIS — N3289 Other specified disorders of bladder: Secondary | ICD-10-CM

## 2019-09-20 ENCOUNTER — Other Ambulatory Visit: Payer: Self-pay | Admitting: Internal Medicine

## 2019-09-20 ENCOUNTER — Ambulatory Visit: Payer: Self-pay | Admitting: Neurology

## 2019-09-20 DIAGNOSIS — R7401 Elevation of levels of liver transaminase levels: Secondary | ICD-10-CM

## 2019-09-20 LAB — COMPREHENSIVE METABOLIC PANEL
ALT: 43 U/L — ABNORMAL HIGH (ref 0–35)
AST: 33 U/L (ref 0–37)
Albumin: 4.3 g/dL (ref 3.5–5.2)
Alkaline Phosphatase: 127 U/L — ABNORMAL HIGH (ref 39–117)
BUN: 18 mg/dL (ref 6–23)
CO2: 28 mEq/L (ref 19–32)
Calcium: 10.5 mg/dL (ref 8.4–10.5)
Chloride: 101 mEq/L (ref 96–112)
Creatinine, Ser: 0.75 mg/dL (ref 0.40–1.20)
GFR: 80.65 mL/min (ref >60.00)
Glucose, Bld: 85 mg/dL (ref 70–99)
Potassium: 4.6 mEq/L (ref 3.5–5.1)
Sodium: 136 mEq/L (ref 135–145)
Total Bilirubin: 0.4 mg/dL (ref 0.2–1.2)
Total Protein: 7.7 g/dL (ref 6.0–8.3)

## 2019-09-20 LAB — CBC WITH DIFFERENTIAL/PLATELET
Basophils Absolute: 0.1 10*3/uL (ref 0.0–0.1)
Basophils Relative: 1 % (ref 0.0–3.0)
Eosinophils Absolute: 0.3 10*3/uL (ref 0.0–0.7)
Eosinophils Relative: 3.2 % (ref 0.0–5.0)
HCT: 42.5 % (ref 36.0–46.0)
Hemoglobin: 14 g/dL (ref 12.0–15.0)
Lymphocytes Relative: 33.9 % (ref 12.0–46.0)
Lymphs Abs: 2.8 10*3/uL (ref 0.7–4.0)
MCHC: 33 g/dL (ref 30.0–36.0)
MCV: 87.6 fl (ref 78.0–100.0)
Monocytes Absolute: 0.6 10*3/uL (ref 0.1–1.0)
Monocytes Relative: 7.1 % (ref 3.0–12.0)
Neutro Abs: 4.6 10*3/uL (ref 1.4–7.7)
Neutrophils Relative %: 54.8 % (ref 43.0–77.0)
Platelets: 313 10*3/uL (ref 150.0–400.0)
RBC: 4.85 Mil/uL (ref 3.87–5.11)
RDW: 13.7 % (ref 11.5–15.5)
WBC: 8.4 10*3/uL (ref 4.0–10.5)

## 2019-09-20 LAB — SEDIMENTATION RATE: Sed Rate: 16 mm/hr (ref 0–30)

## 2019-09-20 LAB — C-REACTIVE PROTEIN: CRP: <1 mg/dL (ref 0.5–20.0)

## 2019-09-21 ENCOUNTER — Other Ambulatory Visit: Payer: Self-pay | Admitting: Neurology

## 2019-09-21 DIAGNOSIS — G500 Trigeminal neuralgia: Secondary | ICD-10-CM

## 2019-09-21 LAB — URINE CULTURE
MICRO NUMBER:: 1146072
Result:: NO GROWTH
SPECIMEN QUALITY:: ADEQUATE

## 2019-09-22 ENCOUNTER — Ambulatory Visit: Payer: Self-pay | Admitting: General Surgery

## 2019-10-04 ENCOUNTER — Ambulatory Visit
Admission: RE | Admit: 2019-10-04 | Discharge: 2019-10-04 | Disposition: A | Discharge status: Home / Self Care | Payer: BC Managed Care – PPO | Source: Ambulatory Visit | Attending: Neurology | Admitting: Neurology

## 2019-10-04 ENCOUNTER — Other Ambulatory Visit: Payer: Self-pay

## 2019-10-04 DIAGNOSIS — G500 Trigeminal neuralgia: Secondary | ICD-10-CM | POA: Insufficient documentation

## 2019-10-04 MED ORDER — GADOBUTROL 1 MMOL/ML IV SOLN
6.0000 mL | Freq: Once | INTRAVENOUS | Status: AC | PRN
  Administered 2019-10-04: 6 mL via INTRAVENOUS

## 2019-11-18 ENCOUNTER — Ambulatory Visit: Payer: BC Managed Care – PPO | Attending: Internal Medicine

## 2019-11-18 DIAGNOSIS — Z20822 Contact with and (suspected) exposure to covid-19: Secondary | ICD-10-CM

## 2019-11-18 DIAGNOSIS — U071 COVID-19: Secondary | ICD-10-CM | POA: Insufficient documentation

## 2019-11-19 LAB — NOVEL CORONAVIRUS, NAA: SARS-CoV-2, NAA: DETECTED — AB

## 2019-11-20 ENCOUNTER — Telehealth: Payer: Self-pay | Admitting: Physician Assistant

## 2019-11-20 NOTE — Telephone Encounter (Signed)
Called to discuss with Shona Simpson about Covid symptoms and the use of bamlanivimab or casirivimab/imdevimab, a monoclonal antibody infusion for those with mild to moderate Covid symptoms and at a high risk of hospitalization.     Pt is qualified for this infusion at the [facility] infusion center due to co-morbid conditions and/or a member of an at-risk group, however declines infusion at this time. Symptoms tier reviewed as well as criteria for ending isolation. Symptoms reviewed that would warrant ED/Hospital evaluation. Preventative practices reviewed. Patient verbalized understanding. Patient advised to call back if he decides that he does want to get infusion. Callback number to the infusion center given. Patient advised to go to Urgent care or ED with severe symptoms. Last date pt would be eligible for infusion is 11/27/19.    Patient Active Problem List   Diagnosis Date Noted  . Cervical disc disorder at C4-C5 level with radiculopathy 09/11/2019  . Jaw pain 09/04/2019  . Primary hyperparathyroidism ([place]) 08/17/2019  . Genetic testing 02/16/2017  . Family history of breast cancer in first degree relative 02/03/2017  . Lymphocytic colitis 08/10/2014  . Gastritis and gastroduodenitis 03/10/2014  . Menopause 08/17/2013  . History of abnormal Pap smear 08/06/2013  . Depression   . Rheumatoid arthritis ([place]) 12/23/2012    Angelena Form PA-C

## 2020-10-20 DIAGNOSIS — Z87442 Personal history of urinary calculi: Secondary | ICD-10-CM

## 2020-10-20 HISTORY — DX: Personal history of urinary calculi: Z87.442

## 2021-02-09 IMAGING — CT CT MAXILLOFACIAL W/O CM
3 of 4 series · 16 of 47 positions shown, 19 images · non-contrast
Comparison: CT head from 02/02/2017.

CLINICAL DATA: Left jaw pain

EXAM:
CT MAXILLOFACIAL WITHOUT CONTRAST
TECHNIQUE: Multidetector CT imaging of the maxillofacial structures was
performed. Multiplanar CT image reconstructions were also generated.

[Series 3: max soft · axial · 0.33mm/px · z∈[-172,-30]mm · 10 of 81 slices shown, 13 images]
[im 5/81  brain]
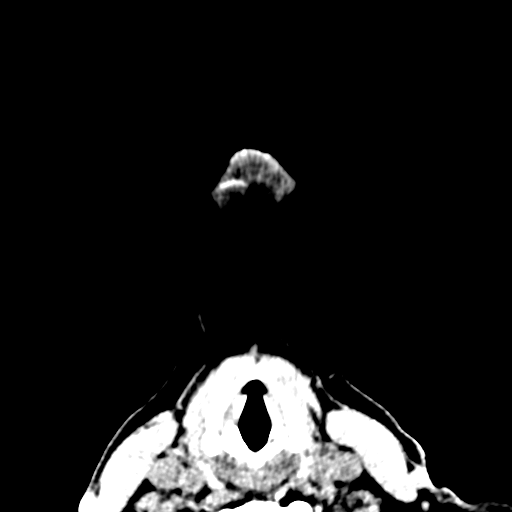
[im 5/81  bone]
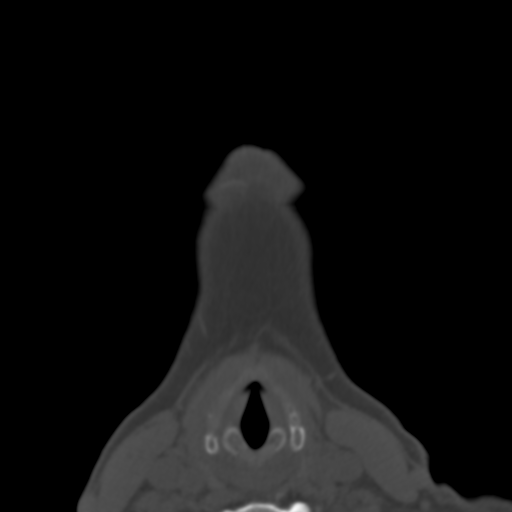
[im 13/81  bone]
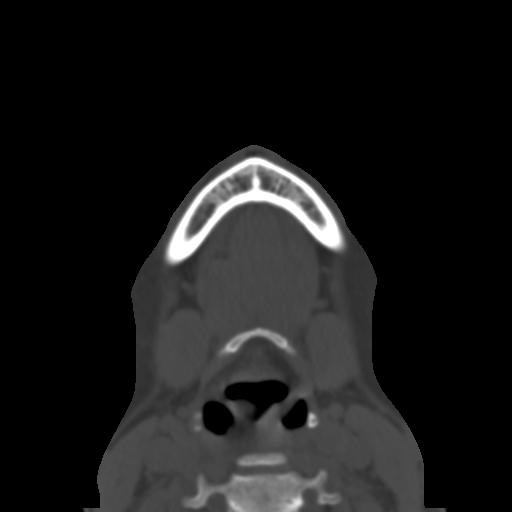
[im 22/81  bone]
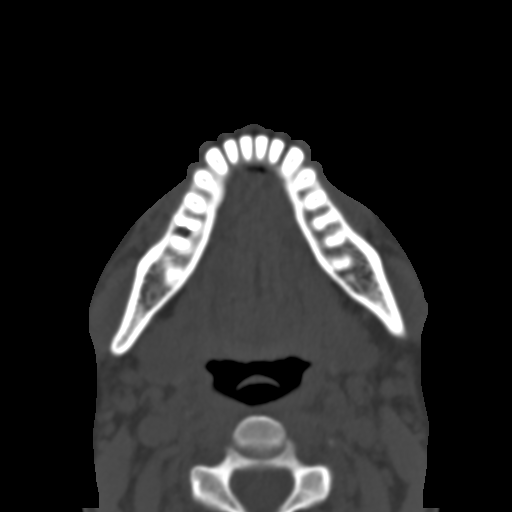
[im 30/81  bone]
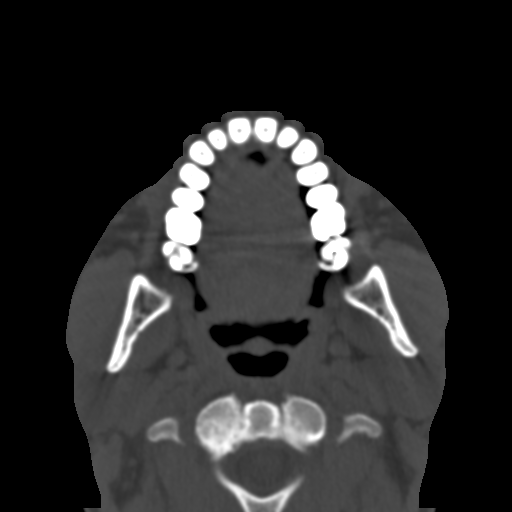
[im 38/81  brain]
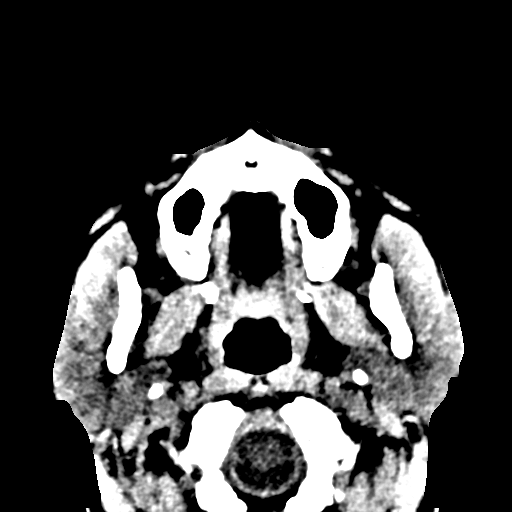
[im 38/81  bone]
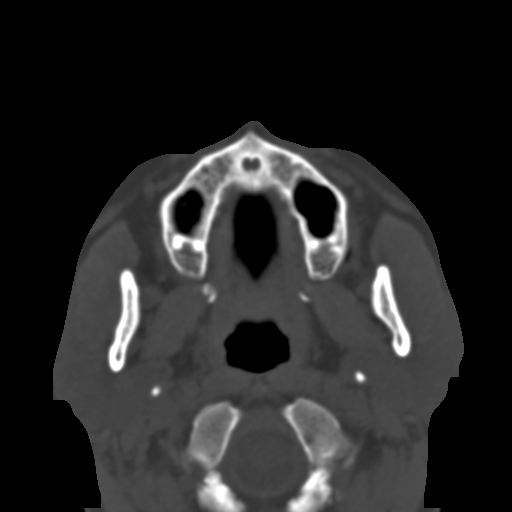
[im 43/81  bone]
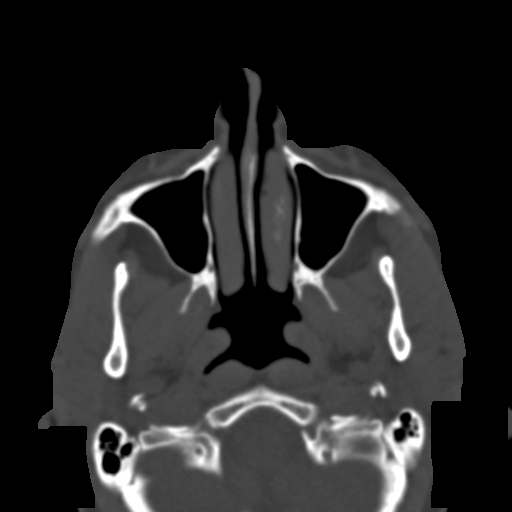
[im 51/81  bone]
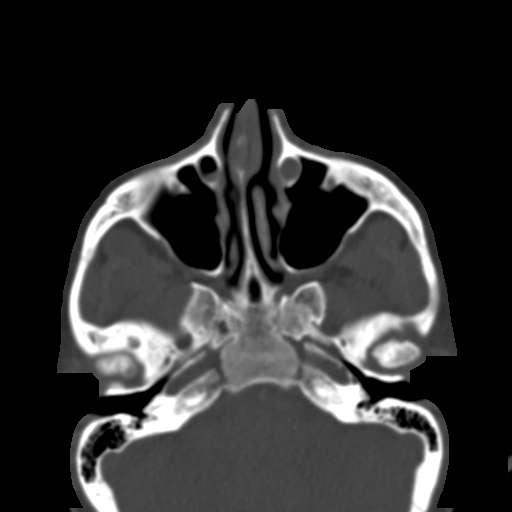
[im 59/81  bone]
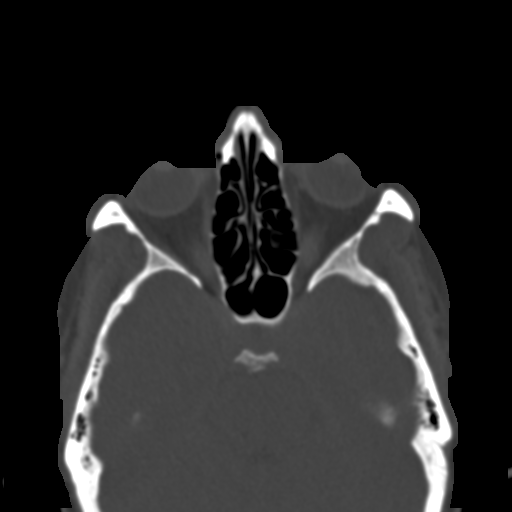
[im 68/81  brain]
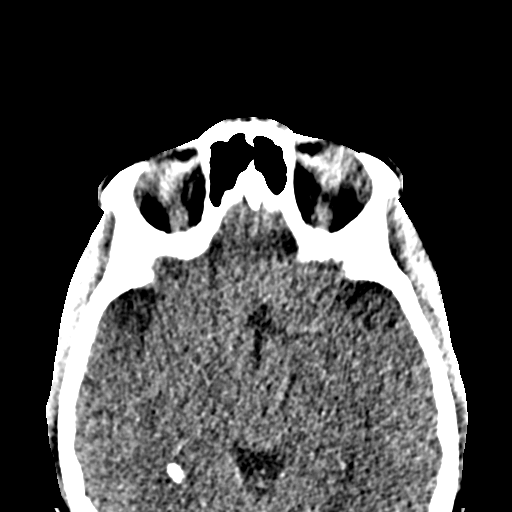
[im 68/81  bone]
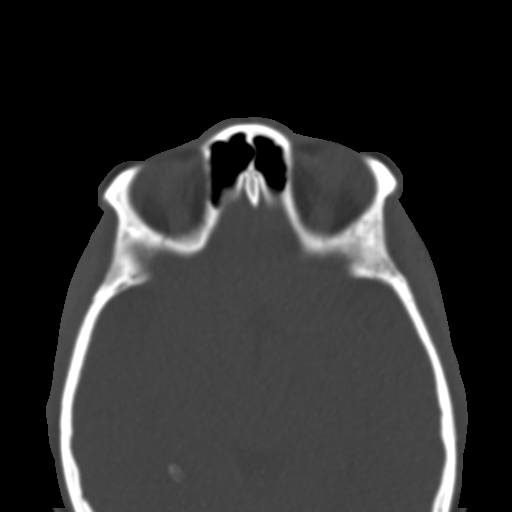
[im 76/81  bone]
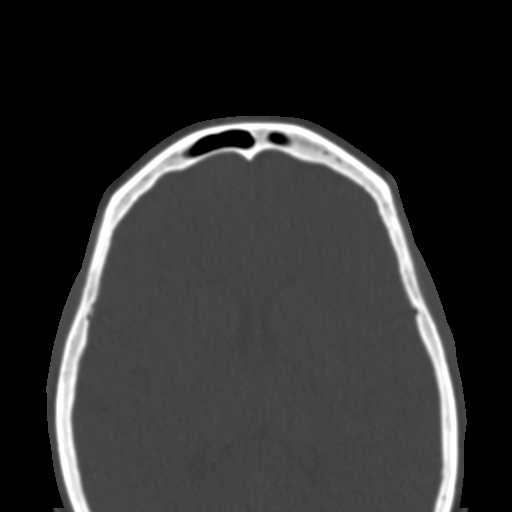

[Series 4: coronal soft · coronal · 0.32mm/px · 3 of 69 slices shown]
[im 23/69  bone]
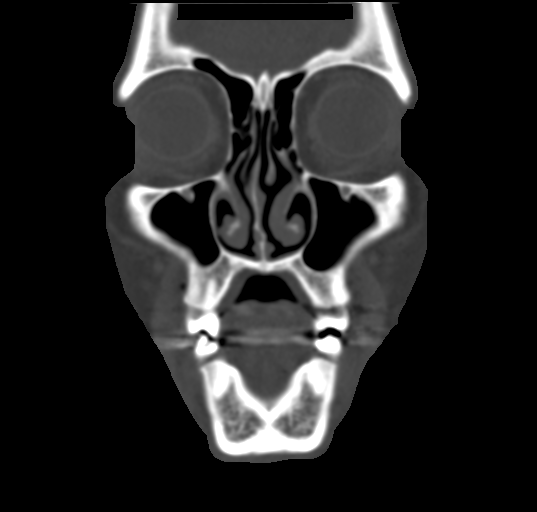
[im 31/69  bone]
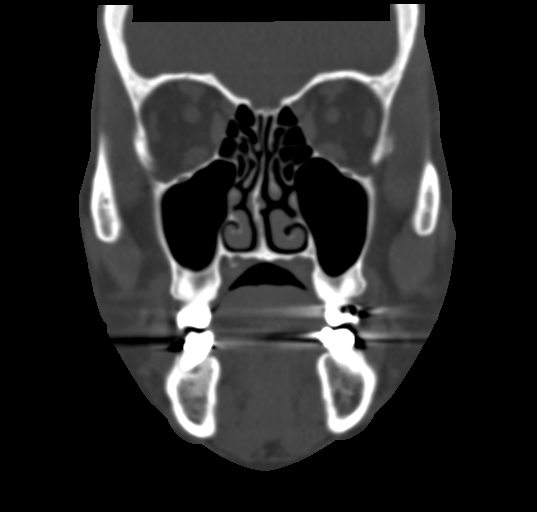
[im 38/69  bone]
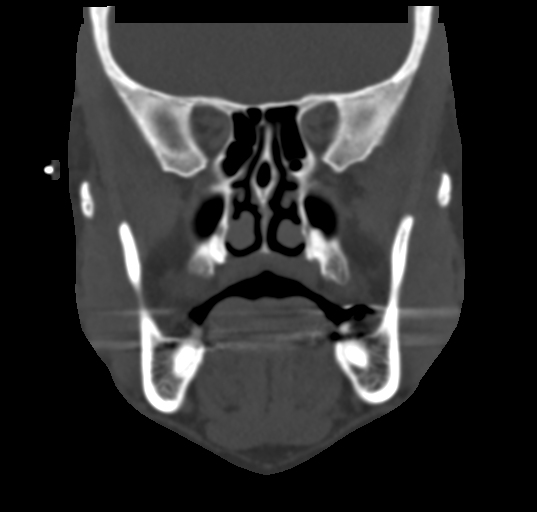

[Series 5: sagittal soft · sagittal · 0.27mm/px · 3 of 88 slices shown]
[im 30/88  bone]
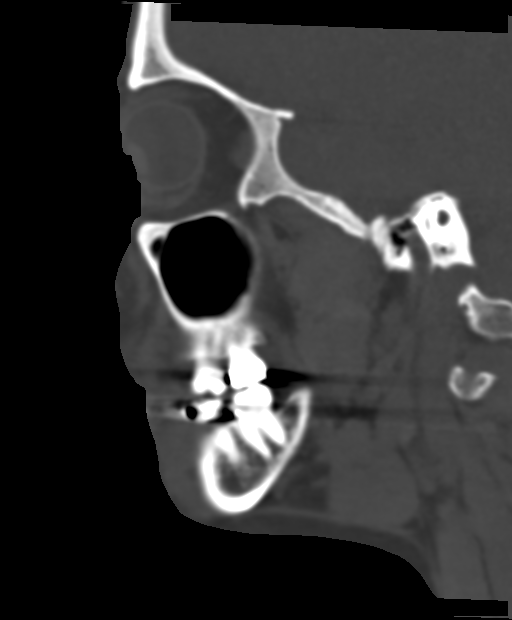
[im 44/88  bone]
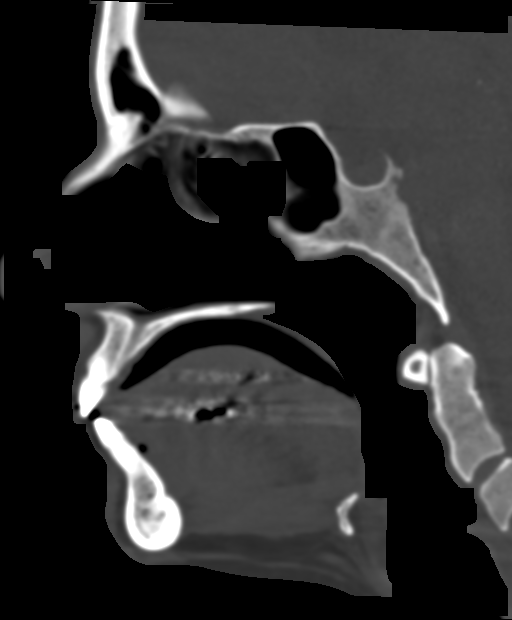
[im 59/88  bone]
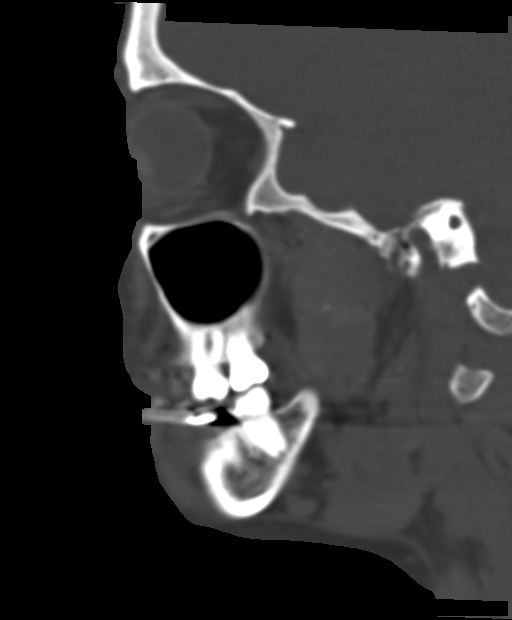

[16 of 47 positions shown; findings below may reference images not displayed]

FINDINGS: Osseous: No fracture or mandibular dislocation. No destructive
process.

Orbits: Negative. No traumatic or inflammatory finding.

Sinuses: Clear.

Soft tissues: Negative.

Limited intracranial: No significant or unexpected finding.
IMPRESSION: No acute findings.

## 2021-04-24 ENCOUNTER — Encounter: Payer: Self-pay | Admitting: Podiatry

## 2021-04-24 ENCOUNTER — Other Ambulatory Visit: Payer: Self-pay

## 2021-04-24 ENCOUNTER — Ambulatory Visit: Payer: BC Managed Care – PPO | Admitting: Podiatry

## 2021-04-24 ENCOUNTER — Other Ambulatory Visit: Payer: Self-pay | Admitting: Podiatry

## 2021-04-24 ENCOUNTER — Ambulatory Visit (INDEPENDENT_AMBULATORY_CARE_PROVIDER_SITE_OTHER): Payer: BC Managed Care – PPO

## 2021-04-24 DIAGNOSIS — M7750 Other enthesopathy of unspecified foot: Secondary | ICD-10-CM

## 2021-04-24 DIAGNOSIS — M19279 Secondary osteoarthritis, unspecified ankle and foot: Secondary | ICD-10-CM | POA: Diagnosis not present

## 2021-04-24 DIAGNOSIS — M7751 Other enthesopathy of right foot: Secondary | ICD-10-CM | POA: Diagnosis not present

## 2021-04-24 NOTE — Progress Notes (Signed)
  Subjective:  Patient ID: Sherry Maxwell, female    DOB: 08-May-1966,  MRN: [id number] HPI Chief Complaint  Patient presents with   Ankle Pain    Anterior ankle right - aching, swelling x several months, hurts more at night, throbs, sometimes can't bear weight in the morning, injury 2 years ago-fell off a scaffold, radiating pain into leg and butt, tried better shoes   New Patient (Initial Visit)    55 y.o. female presents with the above complaint.   ROS: Denies fever chills nausea vomiting muscle aches pains calf pain back pain chest pain shortness of breath.  Past Medical History:  Diagnosis Date   Colitis    Depression    Gastritis    Hypertension    rheumatoid arthritis    Rheumatoid arthritis ([place])    Urinary tract infection    Past Surgical History:  Procedure Laterality Date   COLONOSCOPY  05/2014   FOOT SURGERY     1996 and 2011   [place]   Born with a defect   UPPER GI ENDOSCOPY  05/2014    Current Outpatient Medications:    ivermectin (STROMECTOL) 3 MG TABS tablet, Take by mouth once., Disp: , Rfl:    ENBREL SURECLICK 50 MG/ML injection, Pt takes the 50mg /mL once a week, by injection, Disp: , Rfl:    hydroxychloroquine (PLAQUENIL) 200 MG tablet, Take 200 mg by mouth daily., Disp: , Rfl:   Allergies  Allergen Reactions   Sulfa Antibiotics Swelling and Rash   Review of Systems Objective:  There were no vitals filed for this visit.  General: Well developed, nourished, in no acute distress, alert and oriented x3   Dermatological: Skin is warm, dry and supple bilateral. Nails x 10 are well maintained; remaining integument appears unremarkable at this time. There are no open sores, no preulcerative lesions, no rash or signs of infection present.  Vascular: Dorsalis Pedis artery and Posterior Tibial artery pedal pulses are 2/4 bilateral with immedate capillary fill time. Pedal hair growth present. No varicosities and no lower extremity edema present  bilateral.   Neruologic: Grossly intact via light touch bilateral. Vibratory intact via tuning fork bilateral. Protective threshold with Semmes Wienstein monofilament intact to all pedal sites bilateral. Patellar and Achilles deep tendon reflexes 2+ bilateral. No Babinski or clonus noted bilateral.   Musculoskeletal: No gross boney pedal deformities bilateral. No pain, crepitus, or limitation noted with foot and ankle range of motion bilateral. Muscular strength 5/5 in all groups tested bilateral.  She has limitation of frontal plane range of motion particularly midtarsal joint.  Gait: Unassisted, Nonantalgic.    Radiographs:  Radiographs taken today demonstrate an osseously mature individual no significant finding demonstrates moderate to severe osteoarthritic changes of the talonavicular joint it appears that there is a complete loss of joint space.  No spurring or minimal spurring at the level of the joint.  Assessment & Plan:   Assessment: History of rheumatoid arthritis but this appears to be osteoarthritis of the talonavicular joint right foot.  Plan: Discussed etiology pathology conservative versus surgical therapies.  At this point I highly recommended the use of Voltaren gel and Birkenstock type sandals and finding comfortable shoes to help support that arch.  She understands this and is amenable to it we did discuss the need for surgical intervention and we will consider this in the near future.     Cecillia Menees T. [place], [place]

## 2021-07-29 ENCOUNTER — Encounter: Payer: Self-pay | Admitting: General Surgery

## 2021-08-22 ENCOUNTER — Ambulatory Visit: Payer: BC Managed Care – PPO | Admitting: Urology

## 2021-08-22 ENCOUNTER — Other Ambulatory Visit: Payer: Self-pay

## 2021-08-22 VITALS — BP 140/83 | HR 86 | Ht 61.0 in | Wt 147.0 lb

## 2021-08-22 DIAGNOSIS — R3915 Urgency of urination: Secondary | ICD-10-CM | POA: Diagnosis not present

## 2021-08-22 DIAGNOSIS — R102 Pelvic and perineal pain: Secondary | ICD-10-CM

## 2021-08-22 LAB — BLADDER SCAN AMB NON-IMAGING

## 2021-08-22 MED ORDER — CELECOXIB 200 MG PO CAPS: 200.0000 mg | ORAL_CAPSULE | Freq: Every day | ORAL | 0 refills | Status: DC

## 2021-08-22 NOTE — Progress Notes (Signed)
08/22/21 1:31 PM   Sherry Maxwell [birth year] [id number]  CC: Pelvic pressure  HPI: 55 year old healthy female with reported history of ureteral reimplant as an infant who reports about 2 months of pelvic pressure.  This is improved over the last week, and she has no symptoms today.  She denies any changes in medication or new diet changes.  She denies any incontinence.  Renal function was normal last year with EGFR greater than 60.  She denies any gross hematuria, has had some mild dysuria that resolved with Azo.  She drinks primarily water during the day.  She reportedly was previously followed by Dr. Eliberto Ivory and Zara Council, PA many years ago, but none of those records are available to me.  Urinalysis today benign with 0-5 WBCs, 0 RBCs, few bacteria, 1+ leukocytes, nitrite negative.  PVR normal at 100 mL.  Interestingly, there is a CT report from 2015 ordered for bloating that reportedly shows a possible 9 mm right distal ureteral stone versus phlebolith and some minimal right-sided hydronephrosis.  No records regarding any possible urology follow-up after this image.  PMH: Past Medical History:  Diagnosis Date   Colitis    Depression    Gastritis    Hypertension    rheumatoid arthritis    Rheumatoid arthritis ([place])    Urinary tract infection     Surgical History: Past Surgical History:  Procedure Laterality Date   COLONOSCOPY  05/2014   FOOT SURGERY     1996 and 2011   [place]   Born with a defect   UPPER GI ENDOSCOPY  05/2014    Family History: Family History  Problem Relation Age of Onset   Arthritis Mother    Hypertension Mother    Thyroid disease Mother        Graves Disease   Breast cancer Mother 71       breast   Hypertension Father    Diabetes Father        pancreatitis induced   CAD Father    Thyroid disease Sister        Graves Disease   Breast cancer Sister 67   Thyroid disease Brother        Graves Disease   Hypertension Maternal  Grandmother    Arthritis Paternal Grandmother     Social History:  reports that she has never smoked. She has never used smokeless tobacco. She reports that she does not currently use alcohol. She reports that she does not use drugs.  Physical Exam: BP 140/83 (BP Location: Left Arm, Patient Position: Sitting, Cuff Size: Normal)   Pulse 86   Ht 5' 1"  (1.549 m)   Wt 147 lb (66.7 kg)   BMI 27.78 kg/m    Constitutional:  Alert and oriented, No acute distress. Cardiovascular: No clubbing, cyanosis, or edema. Respiratory: Normal respiratory effort, no increased work of breathing. GI: Abdomen is soft, nontender, nondistended, no abdominal masses  Laboratory Data: Reviewed, see HPI   Assessment & Plan:   55 year old female with 2 months of mild and intermittent pelvic pressure of unclear etiology.  Urinalysis essentially benign today, and PVR normal.  She has a reported history of a bilateral ureteral reimplant, and renal function is normal.  As her symptoms have improved, I recommended a trial of Celebrex x1 week with close follow-up in 1 month for symptom check.  We will also send urine for culture and atypicals.  Consider further imaging with renal ultrasound or CT if persistent  symptoms  Nickolas Madrid, MD 08/22/2021  [facility] [address], [place], [place] [address] [phone]

## 2021-08-23 LAB — URINALYSIS, COMPLETE
Bilirubin, UA: NEGATIVE
Glucose, UA: NEGATIVE
Ketones, UA: NEGATIVE
Nitrite, UA: NEGATIVE
Protein,UA: NEGATIVE
RBC, UA: NEGATIVE
Specific Gravity, UA: <1.005 — ABNORMAL LOW (ref 1.005–1.030)
Urobilinogen, Ur: 0.2 mg/dL (ref 0.2–1.0)
pH, UA: 6 (ref 5.0–7.5)

## 2021-08-23 LAB — MICROSCOPIC EXAMINATION: RBC, Urine: NONE SEEN /hpf (ref 0–2)

## 2021-08-25 LAB — CULTURE, URINE COMPREHENSIVE

## 2021-08-28 ENCOUNTER — Other Ambulatory Visit: Payer: Self-pay | Admitting: Urology

## 2021-08-28 LAB — MYCOPLASMA / UREAPLASMA CULTURE
Mycoplasma hominis Culture: NEGATIVE
Ureaplasma urealyticum: NEGATIVE

## 2021-10-03 ENCOUNTER — Ambulatory Visit: Payer: BC Managed Care – PPO | Admitting: Urology

## 2021-10-17 ENCOUNTER — Other Ambulatory Visit: Payer: Self-pay | Admitting: *Deleted

## 2021-10-17 ENCOUNTER — Ambulatory Visit: Payer: BC Managed Care – PPO | Admitting: Physician Assistant

## 2021-10-17 ENCOUNTER — Encounter: Payer: Self-pay | Admitting: Physician Assistant

## 2021-10-17 ENCOUNTER — Telehealth: Payer: Self-pay | Admitting: *Deleted

## 2021-10-17 ENCOUNTER — Ambulatory Visit
Admission: RE | Admit: 2021-10-17 | Discharge: 2021-10-17 | Disposition: A | Discharge status: Home / Self Care | Payer: BC Managed Care – PPO | Source: Ambulatory Visit | Attending: Physician Assistant | Admitting: Physician Assistant

## 2021-10-17 ENCOUNTER — Other Ambulatory Visit: Payer: Self-pay

## 2021-10-17 VITALS — BP 130/81 | HR 80 | Ht 61.0 in | Wt 155.0 lb

## 2021-10-17 DIAGNOSIS — N200 Calculus of kidney: Secondary | ICD-10-CM | POA: Diagnosis not present

## 2021-10-17 DIAGNOSIS — N201 Calculus of ureter: Secondary | ICD-10-CM | POA: Diagnosis not present

## 2021-10-17 DIAGNOSIS — R3915 Urgency of urination: Secondary | ICD-10-CM

## 2021-10-17 LAB — URINALYSIS, COMPLETE
Bilirubin, UA: NEGATIVE
Glucose, UA: NEGATIVE
Ketones, UA: NEGATIVE
Nitrite, UA: POSITIVE — AB
Protein,UA: NEGATIVE
Specific Gravity, UA: 1.01 (ref 1.005–1.030)
Urobilinogen, Ur: 0.2 mg/dL (ref 0.2–1.0)
pH, UA: 6 (ref 5.0–7.5)

## 2021-10-17 LAB — MICROSCOPIC EXAMINATION

## 2021-10-17 MED ORDER — TAMSULOSIN HCL 0.4 MG PO CAPS: 0.4000 mg | ORAL_CAPSULE | Freq: Every day | ORAL | 0 refills | Status: DC

## 2021-10-17 NOTE — Telephone Encounter (Signed)
Received call from Dr. Cephas Darby regarding an obstructing kidney stone seen on CT. Patient was seen at his office 10/16/21, received fluids, pain medication and zofran. Per Dr. Jimmye Norman patient  having a lot of flank pain. Called patient, symptoms improved some, still in discomfort. Added on to be seen for an evaluation in clinic today.

## 2021-10-17 NOTE — Progress Notes (Signed)
10/17/2021 12:24 PM   Sherry Maxwell 07/02/1966 [id number]  CC: Chief Complaint  Patient presents with   Nephrolithiasis   HPI: Sherry Maxwell is a 55 y.o. female with PMH ureteral reimplant as an infant, pelvic pressure, and nephrolithiasis who presents today for evaluation of a left UVJ stone.   She sought care at [facility] yesterday with reports of sudden return of her pelvic pain that radiated to the LLQ. A CT stone study was performed, which showed an 21mm left UVJ stone with moderate left hydroureteronephrosis. She was also found to have nonobstructing left nephrolithiasis. She received Zofran and hydrocodone but it does not appear a UA was performed.  Interestingly, she reports that her pain eased up significantly during her CT scan.  She reports feeling rather well today with minimal to no pain.  She notes that she took Azo yesterday and denies dysuria today.  Dr. Bernardo Heater and I were able to review her CT scan from yesterday.  There is an approximate 8 mm stone that appears to be within the bladder lumen versus left UVJ.  She had significant left hydroureter.  Stone measures approximately 570 Hounsfield units.  There has been interval clearance of the right UVJ stone seen on CT stone study dated 08/11/2014.  She underwent KUB today with no evidence of radiopaque UVJ versus bladder stone.  She does not believe that she passed a stone overnight.  Notably, patient reports a family history of nephrolithiasis and parathyroid adenoma.  In-office UA today positive for 2+ blood, nitrites, and 2+ leukocyte esterase; urine microscopy with 11-30 WBCs/HPF.  PMH: Past Medical History:  Diagnosis Date   Colitis    Depression    Gastritis    Hypertension    rheumatoid arthritis    Rheumatoid arthritis ([place])    Urinary tract infection     Surgical History: Past Surgical History:  Procedure Laterality Date   COLONOSCOPY  05/2014   FOOT SURGERY     1996 and 2011   [place]   Born with a defect   UPPER GI ENDOSCOPY  05/2014    Home Medications:  Allergies as of 10/17/2021       Reactions   Sulfa Antibiotics Swelling, Rash        Medication List        Accurate as of October 17, 2021 12:24 PM. If you have any questions, ask your nurse or doctor.          celecoxib 200 MG capsule Commonly known as: CeleBREX Take 1 capsule (200 mg total) by mouth daily.   Enbrel SureClick 50 MG/ML injection Generic drug: etanercept Pt takes the 50mg /mL once a week, by injection   hydroxychloroquine 200 MG tablet Commonly known as: PLAQUENIL Take 200 mg by mouth daily.   ondansetron 4 MG disintegrating tablet Commonly known as: ZOFRAN-ODT Take 4 mg by mouth every 8 (eight) hours as needed.   oxyCODONE-acetaminophen 5-325 MG tablet Commonly known as: PERCOCET/ROXICET Take 1 tablet by mouth 4 (four) times daily as needed.        Allergies:  Allergies  Allergen Reactions   Sulfa Antibiotics Swelling and Rash    Family History: Family History  Problem Relation Age of Onset   Arthritis Mother    Hypertension Mother    Thyroid disease Mother        Graves Disease   Breast cancer Mother 34       breast   Hypertension Father  Diabetes Father        pancreatitis induced   CAD Father    Thyroid disease Sister        Graves Disease   Breast cancer Sister 105   Thyroid disease Brother        Graves Disease   Hypertension Maternal Grandmother    Arthritis Paternal Grandmother     Social History:   reports that she has never smoked. She has never used smokeless tobacco. She reports that she does not currently use alcohol. She reports that she does not use drugs.  Physical Exam: BP 130/81    Pulse 80    Ht 5\' 1"  (1.549 m)    Wt 155 lb (70.3 kg)    BMI 29.29 kg/m   Constitutional:  Alert and oriented, no acute distress, nontoxic appearing HEENT: [place], AT Cardiovascular: No clubbing, cyanosis, or edema Respiratory: Normal  respiratory effort, no increased work of breathing Skin: No rashes, bruises or suspicious lesions Neurologic: Grossly intact, no focal deficits, moving all 4 extremities Psychiatric: Normal mood and affect  Laboratory Data: Results for orders placed or performed in visit on 10/17/21  Microscopic Examination   Urine  Result Value Ref Range   WBC, UA 11-30 (A) 0 - 5 /hpf   RBC 0-2 0 - 2 /hpf   Epithelial Cells (non renal) 0-10 0 - 10 /hpf   Bacteria, UA Few None seen/Few  Urinalysis, Complete  Result Value Ref Range   Specific Gravity, UA 1.010 1.005 - 1.030   pH, UA 6.0 5.0 - 7.5   Color, UA Yellow Yellow   Appearance Ur Hazy (A) Clear   Leukocytes,UA 2+ (A) Negative   Protein,UA Negative Negative/Trace   Glucose, UA Negative Negative   Ketones, UA Negative Negative   RBC, UA 2+ (A) Negative   Bilirubin, UA Negative Negative   Urobilinogen, Ur 0.2 0.2 - 1.0 mg/dL   Nitrite, UA Positive (A) Negative   Microscopic Examination See below:    Pertinent Imaging: KUB, 10/17/2021: CLINICAL DATA:  Kidney stone follow-up.  No pain today.   EXAM: ABDOMEN - 1 VIEW   COMPARISON:  CT abdomen pelvis dated August 11, 2014.   FINDINGS: Unchanged punctate calculus in the lower pole of the left kidney. No additional calculi identified. Unchanged phleboliths in the pelvis. Normal bowel gas pattern. No acute osseous abnormality.   IMPRESSION: 1. Unchanged left nephrolithiasis.     Electronically Signed   By: Titus Dubin M.D.   On: 10/18/2021 09:07  I personally reviewed the images referenced above and note no radiopaque left UVJ versus bladder stone.  Assessment & Plan:   1. Left ureteral stone Per imaging review and patient report, I think it is likely that the stone passed from her left UVJ into the bladder during her CT scan yesterday.  She is asymptomatic today and already has Zofran and opioids on hand for symptom control.  Nitrites are likely false positive in the setting  of Azo use yesterday.  Pyuria is consistent with stone episode.  Will send for culture to rule out underlying infection, though she has not clinically infected today.  We discussed starting Flomax and using a strainer to capture the stone if she passes it spontaneously.  We will plan to have her back in 2 weeks to see Dr. Diamantina Providence for symptom recheck and will obtain a renal ultrasound prior to reassess her significant left hydroureteronephrosis, which is consistent with a subacute to chronic process. - Urinalysis, Complete -  CULTURE, URINE COMPREHENSIVE - US RENAL; Future - tamsulosin (FLOMAX) 0.4 MG CAPS capsule; Take 1 capsule (0.4 mg total) by mouth daily.  Dispense: 30 capsule; Refill: 0   Return in about 2 weeks (around 10/31/2021) for Stone f/u with UA + RUS prior.  Debroah Loop, PA-C  [facility] [address], [place], [place] [address] [phone]

## 2021-10-17 NOTE — Patient Instructions (Signed)
For the next 4 weeks, please do the following: -Take Flomax 0.4mg  daily as prescribed today -Stay well hydrated -Strain your urine to catch any stones that pass -Treat any pain with ibuprofen/tylenol or oxycodone -Treat any nausea with Zofran  Dr. Diamantina Providence will plan to see you back in clinic in 2 weeks with an ultrasound prior to see if you have passed the stone.  Please call our office immediately (we are open 8a-5p Monday-Friday) or go to the Emergency Department if you develop any of the following: -Fever -Chills -Nausea and/or vomiting uncontrollable with Zofran -Pain uncontrollable with Percocet

## 2021-10-23 LAB — CULTURE, URINE COMPREHENSIVE

## 2021-10-24 ENCOUNTER — Encounter: Payer: Self-pay | Admitting: *Deleted

## 2021-10-25 ENCOUNTER — Telehealth: Payer: Self-pay | Admitting: Urology

## 2021-10-25 NOTE — Telephone Encounter (Signed)
Patient was seen in the office on 10/17/21 by Aldona Bar and had a stone which patient stated that she passed on 01-Nov-2021.  She stated that she is scheduled for a [place] on 10/29/21.  She is asking if she still needs to have the US done since she passed the stone.

## 2021-10-25 NOTE — Telephone Encounter (Signed)
Does not need renal ultrasound, please send her the stone prevention instructions and she can follow-up in a year with me with KUB prior   Nickolas Madrid, MD  10/25/2021   Called pt informed her of the information below. Upcoming results appt cancelled, 29yr follow up scheduled.

## 2021-10-29 ENCOUNTER — Ambulatory Visit: Payer: BC Managed Care – PPO

## 2021-10-31 ENCOUNTER — Ambulatory Visit: Payer: BC Managed Care – PPO | Admitting: Urology

## 2021-11-13 ENCOUNTER — Other Ambulatory Visit: Payer: Self-pay | Admitting: Physician Assistant

## 2021-11-13 DIAGNOSIS — N201 Calculus of ureter: Secondary | ICD-10-CM

## 2021-11-14 ENCOUNTER — Ambulatory Visit: Payer: BC Managed Care – PPO | Admitting: Urology

## 2022-10-16 ENCOUNTER — Ambulatory Visit: Payer: BC Managed Care – PPO | Admitting: Urology

## 2023-07-08 ENCOUNTER — Telehealth: Payer: Self-pay

## 2023-07-08 NOTE — Telephone Encounter (Signed)
LMTCB. Need to find out if pt is still seeing Dr. Darrick Huntsman as her PCP.

## 2024-11-14 NOTE — Progress Notes (Signed)
 Sent message, via epic in basket, requesting orders in epic from Careers adviser.

## 2024-11-15 NOTE — Patient Instructions (Signed)
 SURGICAL WAITING ROOM VISITATION Patients having surgery or a procedure may have no more than 2 support people in the waiting area - these visitors may rotate in the visitor waiting room.   If the patient needs to stay at the hospital during part of their recovery, the visitor guidelines for inpatient rooms apply.  PRE-OP VISITATION  Pre-op nurse will coordinate an appropriate time for 1 support person to accompany the patient in pre-op.  This support person may not rotate.  This visitor will be contacted when the time is appropriate for the visitor to come back in the pre-op area.  To keep our patients, visitors and teammates safe and prevent the spread of respiratory illnesses over the next few months.  Temporary Visitor Restrictions  Children ages 49 and under will not be able to visit patients in [facility] under most circumstances. Visitation is not restricted outside of hospitals unless noted otherwise in the [facility] and Location Specific Visitation Guidelines at:       [email or web address]  Visitors with respiratory illnesses are discouraged from visiting and should remain at home. You are not required to quarantine at this time prior to your surgery. However, you must do this: Hand Hygiene often Do NOT share personal items Notify your provider if you are in close contact with someone who has COVID or you develop fever 100.4 or greater, new onset of sneezing, cough, sore throat, shortness of breath or body aches.  If you test positive for Covid or have been in contact with anyone that has tested positive in the last 10 days please notify you surgeon.    Your procedure is scheduled on:  TUESDAY  11-22-2024  Report to [facility] Main Entrance: [address] entrance where the [employer] is available.   Report to admitting at: 05:15    AM  Call this number if you have any questions or problems the morning of surgery [phone]  DO NOT EAT OR DRINK ANYTHING AFTER  MIDNIGHT THE NIGHT PRIOR TO YOUR SURGERY / PROCEDURE.   FOLLOW  ANY ADDITIONAL PRE OP INSTRUCTIONS YOU RECEIVED FROM YOUR SURGEON'S OFFICE!!!   Oral Hygiene is also important to reduce your risk of infection.        Remember - BRUSH YOUR TEETH THE MORNING OF SURGERY WITH YOUR REGULAR TOOTHPASTE  Do NOT smoke after Midnight the night before surgery.  STOP TAKING all Vitamins, Herbs and supplements 1 week before your surgery.   Take ONLY these medicines the morning of surgery with A SIP OF WATER:  none  DO NOT TAKE  Hydroxychloroquine (PLAQUENIL) the morning of your surgery.                    You may not have any metal on your body including hair pins, jewelry, and body piercing  Do not wear make-up, lotions, powders, perfumes or deodorant  Do not wear nail polish including gel and S&S, artificial / acrylic nails, or any other type of covering on natural nails including finger and toenails. If you have artificial nails, gel coating, etc., that needs to be removed by a nail salon, Please have this removed prior to surgery. Not doing so may mean that your surgery could be cancelled or delayed if the Surgeon or anesthesia staff feels like they are unable to monitor you safely.   Do not shave 48 hours prior to surgery to avoid nicks in your skin which may contribute to postoperative infections.   Contacts, Hearing Aids,  dentures or bridgework may not be worn into surgery. DENTURES WILL BE REMOVED PRIOR TO SURGERY PLEASE DO NOT APPLY Poly grip OR ADHESIVES!!!  You may bring a small overnight bag with you on the day of surgery, only pack items that are not valuable. [facility] IS NOT RESPONSIBLE   FOR VALUABLES THAT ARE LOST OR STOLEN.   Do not bring your home medications to the hospital. The Pharmacy will dispense medications listed on your medication list to you during your admission in the Hospital.  Please read over the following fact sheets you were given: IF YOU HAVE QUESTIONS ABOUT  YOUR PRE-OP INSTRUCTIONS, PLEASE CALL [phone].   [facility] - Preparing for Surgery         Before surgery, you can play an important role.  Because skin is not sterile, your skin needs to be as free of germs as possible.  You can reduce the number of germs on your skin by washing with CHG (chlorahexidine gluconate) soap before surgery.  CHG is an antiseptic cleaner which kills germs and bonds with the skin to continue killing germs even after washing. Please DO NOT use if you have an allergy to CHG or antibacterial soaps.  If your skin becomes reddened/irritated stop using the CHG and inform your nurse when you arrive at Short Stay. Do not shave (including legs and underarms) for at least 48 hours prior to the first CHG shower.  You may shave your face/neck.  Please follow these instructions carefully:  1.  Shower with CHG Soap the night before surgery ONLY (DO NOT USE THE CHG SOAP THE MORNING OF SURGERY).  2.  If you choose to wash your hair, wash your hair first as usual with your normal  shampoo.  3.  After you shampoo, rinse your hair and body thoroughly to remove the shampoo.                             4.  Use CHG as you would any other liquid soap.  You can apply chg directly to the skin and wash.  Gently with a scrungie or clean washcloth.  5.  Apply the CHG Soap to your body ONLY FROM THE NECK DOWN.   Do not use on face/ open                           Wound or open sores. Avoid contact with eyes, ears mouth and genitals (private parts).                       Wash face,  Genitals (private parts) with your normal soap.             6.  Wash thoroughly, paying special attention to the area where your  surgery  will be performed.  7.  Thoroughly rinse your body with warm water from the neck down.  8.  DO NOT shower/wash with your normal soap after using and rinsing off the CHG Soap.                9.  Pat yourself dry with a clean towel.            10.  Wear clean pajamas.             11.  Place clean sheets on your bed the night of your first shower and do  not  sleep with pets.  Day of Surgery : Do not apply any CHG, lotions/deodorants the morning of surgery.  Please wear clean clothes to the hospital/surgery center.   FAILURE TO FOLLOW THESE INSTRUCTIONS MAY RESULT IN THE CANCELLATION OF YOUR SURGERY  PATIENT SIGNATURE_________________________________  NURSE SIGNATURE__________________________________  ________________________________________________________________________

## 2024-11-15 NOTE — Progress Notes (Signed)
 Date of any COVID positive Test in last 90 days:  PCP - Verneita Kettering, MD [phone] (Work)  [phone] (Fax)  Cardiologist -  Rheumatology- Elsie Brewster, PA at [facility]   Chest x-ray -09-04-2019  2v    EKG -  2020 Stress Test -  ECHO -  Cardiac Cath -  CT Coronary Calcium score:  ZIO monitor-   Pacemaker / ICD device [x]  No []  Yes   Spinal Cord Stimulator:[x]  No []  Yes       History of Sleep Apnea? [x]  No []  Yes   CPAP used?- [x]  No []  Yes    Medication on DOS: none  Hold DOS:  Hydroxychloroquine (PLAQUENIL)  Patient has: [x]  NO Hx DM   []  Pre-DM   []  DM1  []   DM2 Does the patient monitor blood sugar?   [x]  N/A   []  No []  Yes   Blood Thinner / Instructions: none Aspirin Instructions:  none  Activity level: Able to walk up 2 flights of stairs without becoming significantly short of breath or having chest pain?   [x]    Yes   []  No,  would have:  Patient can perform ADLs without assistance.  [x]   Yes    []  No   Comments:   Anesthesia review: severe Stage 3 RA, Depression, Raynaud's, ?LFTs,   Patient denies any S&S of respiratory illness or Covid - no shortness of breath, fever, cough or chest pain at PAT appointment.  Patient verbalized understanding and agreement to the Pre-Surgical Instructions that were given to them at this PAT appointment. Patient was also educated of the need to review these PAT instructions again prior to her surgery.I reviewed the appropriate phone numbers to call if they have any and questions or concerns.

## 2024-11-16 ENCOUNTER — Encounter (HOSPITAL_COMMUNITY)
Admission: RE | Admit: 2024-11-16 | Discharge: 2024-11-16 | Disposition: A | Discharge status: Home / Self Care | Source: Ambulatory Visit | Attending: Orthopedic Surgery | Admitting: Orthopedic Surgery

## 2024-11-16 ENCOUNTER — Encounter (HOSPITAL_COMMUNITY): Payer: Self-pay | Admitting: *Deleted

## 2024-11-16 ENCOUNTER — Other Ambulatory Visit: Payer: Self-pay

## 2024-11-16 VITALS — BP 142/68 | HR 94 | Temp 98.7°F | Resp 16 | Ht 61.0 in | Wt 122.0 lb

## 2024-11-16 DIAGNOSIS — M05742 Rheumatoid arthritis with rheumatoid factor of left hand without organ or systems involvement: Secondary | ICD-10-CM | POA: Insufficient documentation

## 2024-11-16 DIAGNOSIS — Z79899 Other long term (current) drug therapy: Secondary | ICD-10-CM | POA: Diagnosis not present

## 2024-11-16 DIAGNOSIS — Z01818 Encounter for other preprocedural examination: Secondary | ICD-10-CM

## 2024-11-16 DIAGNOSIS — M05741 Rheumatoid arthritis with rheumatoid factor of right hand without organ or systems involvement: Secondary | ICD-10-CM | POA: Diagnosis not present

## 2024-11-16 DIAGNOSIS — Z01812 Encounter for preprocedural laboratory examination: Secondary | ICD-10-CM | POA: Insufficient documentation

## 2024-11-16 HISTORY — DX: Anemia, unspecified: D64.9

## 2024-11-16 HISTORY — DX: Chronic kidney disease, unspecified: N18.9

## 2024-11-16 HISTORY — DX: Benign neoplasm of parathyroid gland: D35.1

## 2024-11-16 LAB — COMPREHENSIVE METABOLIC PANEL WITH GFR
ALT: 27 U/L (ref 0–44)
AST: 28 U/L (ref 15–41)
Albumin: 4.3 g/dL (ref 3.5–5.0)
Alkaline Phosphatase: 160 U/L — ABNORMAL HIGH (ref 38–126)
Anion gap: 10 (ref 5–15)
BUN: 9 mg/dL (ref 6–20)
CO2: 26 mmol/L (ref 22–32)
Calcium: 11.2 mg/dL — ABNORMAL HIGH (ref 8.9–10.3)
Chloride: 104 mmol/L (ref 98–111)
Creatinine, Ser: 0.78 mg/dL (ref 0.44–1.00)
GFR, Estimated: >60 mL/min
Glucose, Bld: 114 mg/dL — ABNORMAL HIGH (ref 70–99)
Potassium: 4 mmol/L (ref 3.5–5.1)
Sodium: 140 mmol/L (ref 135–145)
Total Bilirubin: 0.4 mg/dL (ref 0.0–1.2)
Total Protein: 8.1 g/dL (ref 6.5–8.1)

## 2024-11-16 LAB — CBC
HCT: 45.6 % (ref 36.0–46.0)
Hemoglobin: 14.4 g/dL (ref 12.0–15.0)
MCH: 27.7 pg (ref 26.0–34.0)
MCHC: 31.6 g/dL (ref 30.0–36.0)
MCV: 87.9 fL (ref 80.0–100.0)
Platelets: 368 10*3/uL (ref 150–400)
RBC: 5.19 MIL/uL — ABNORMAL HIGH (ref 3.87–5.11)
RDW: 13.2 % (ref 11.5–15.5)
WBC: 6.5 10*3/uL (ref 4.0–10.5)
nRBC: 0 % (ref 0.0–0.2)

## 2024-11-16 LAB — SURGICAL PCR SCREEN
MRSA, PCR: NEGATIVE
Staphylococcus aureus: NEGATIVE

## 2024-11-21 ENCOUNTER — Encounter (HOSPITAL_COMMUNITY): Payer: Self-pay | Admitting: Orthopedic Surgery

## 2024-11-22 ENCOUNTER — Ambulatory Visit (HOSPITAL_COMMUNITY): Admitting: Certified Registered Nurse Anesthetist

## 2024-11-22 ENCOUNTER — Encounter (HOSPITAL_COMMUNITY): Admission: RE | Payer: Self-pay | Source: Ambulatory Visit

## 2024-11-22 ENCOUNTER — Encounter (HOSPITAL_COMMUNITY): Payer: Self-pay | Admitting: Orthopedic Surgery

## 2024-11-22 ENCOUNTER — Observation Stay (HOSPITAL_COMMUNITY)
Admission: RE | Admit: 2024-11-22 | Discharge: 2024-11-23 | Disposition: A | Source: Ambulatory Visit | Attending: Orthopedic Surgery | Admitting: Orthopedic Surgery

## 2024-11-22 ENCOUNTER — Other Ambulatory Visit: Payer: Self-pay

## 2024-11-22 DIAGNOSIS — M19022 Primary osteoarthritis, left elbow: Principal | ICD-10-CM | POA: Diagnosis present

## 2024-11-22 MED ORDER — PROPOFOL 10 MG/ML IV BOLUS
INTRAVENOUS | Status: AC
  Filled 2024-11-22: qty 20

## 2024-11-22 MED ORDER — MIDAZOLAM HCL 2 MG/2ML IJ SOLN
INTRAMUSCULAR | Status: AC
  Filled 2024-11-22: qty 2

## 2024-11-22 MED ORDER — CEFAZOLIN SODIUM-DEXTROSE 2-4 GM/100ML-% IV SOLN
2.0000 g | INTRAVENOUS | Status: AC
  Administered 2024-11-22: 2 g via INTRAVENOUS
  Filled 2024-11-22: qty 100

## 2024-11-22 MED ORDER — MIDAZOLAM HCL 5 MG/5ML IJ SOLN
INTRAMUSCULAR | Status: DC | PRN
  Administered 2024-11-22: 2 mg via INTRAVENOUS

## 2024-11-22 MED ORDER — METHOCARBAMOL 1000 MG/10ML IJ SOLN: 500.0000 mg | Freq: Four times a day (QID) | INTRAMUSCULAR | Status: DC | PRN

## 2024-11-22 MED ORDER — LACTATED RINGERS IV SOLN: INTRAVENOUS | Status: DC

## 2024-11-22 MED ORDER — FENTANYL CITRATE (PF) 50 MCG/ML IJ SOSY: 25.0000 ug | PREFILLED_SYRINGE | INTRAMUSCULAR | Status: DC | PRN

## 2024-11-22 MED ORDER — OXYCODONE HCL 5 MG PO TABS: 5.0000 mg | ORAL_TABLET | Freq: Once | ORAL | Status: DC | PRN

## 2024-11-22 MED ORDER — CEFAZOLIN SODIUM-DEXTROSE 1-4 GM/50ML-% IV SOLN
1.0000 g | INTRAVENOUS | Status: AC
  Administered 2024-11-22: 1 g via INTRAVENOUS
  Filled 2024-11-22: qty 50

## 2024-11-22 MED ORDER — CEFAZOLIN SODIUM-DEXTROSE 1-4 GM/50ML-% IV SOLN
1.0000 g | Freq: Three times a day (TID) | INTRAVENOUS | Status: DC
  Administered 2024-11-22 – 2024-11-23 (×2): 1 g via INTRAVENOUS
  Filled 2024-11-22 (×2): qty 50

## 2024-11-22 MED ORDER — SUGAMMADEX SODIUM 200 MG/2ML IV SOLN
INTRAVENOUS | Status: DC | PRN
  Administered 2024-11-22: 100 mg via INTRAVENOUS

## 2024-11-22 MED ORDER — OXYCODONE HCL 5 MG/5ML PO SOLN: 5.0000 mg | Freq: Once | ORAL | Status: DC | PRN

## 2024-11-22 MED ORDER — DEXAMETHASONE SOD PHOSPHATE PF 10 MG/ML IJ SOLN
INTRAMUSCULAR | Status: AC
  Filled 2024-11-22: qty 1

## 2024-11-22 MED ORDER — ROCURONIUM BROMIDE 100 MG/10ML IV SOLN
INTRAVENOUS | Status: DC | PRN
  Administered 2024-11-22: 50 mg via INTRAVENOUS

## 2024-11-22 MED ORDER — ACETAMINOPHEN 500 MG PO TABS
1000.0000 mg | ORAL_TABLET | Freq: Four times a day (QID) | ORAL | Status: DC
  Administered 2024-11-22: 1000 mg via ORAL
  Filled 2024-11-22: qty 2

## 2024-11-22 MED ORDER — FENTANYL CITRATE (PF) 100 MCG/2ML IJ SOLN
INTRAMUSCULAR | Status: AC
  Filled 2024-11-22: qty 2

## 2024-11-22 MED ORDER — IBUPROFEN 400 MG PO TABS
600.0000 mg | ORAL_TABLET | Freq: Four times a day (QID) | ORAL | Status: DC
  Administered 2024-11-22 – 2024-11-23 (×2): 600 mg via ORAL
  Filled 2024-11-22 (×2): qty 1

## 2024-11-22 MED ORDER — ALPRAZOLAM 0.5 MG PO TABS: 0.5000 mg | ORAL_TABLET | Freq: Four times a day (QID) | ORAL | Status: DC | PRN

## 2024-11-22 MED ORDER — ONDANSETRON HCL 4 MG PO TABS: 4.0000 mg | ORAL_TABLET | Freq: Four times a day (QID) | ORAL | Status: DC | PRN

## 2024-11-22 MED ORDER — CHLORHEXIDINE GLUCONATE 0.12 % MT SOLN
15.0000 mL | Freq: Once | OROMUCOSAL | Status: AC
  Administered 2024-11-22: 15 mL via OROMUCOSAL

## 2024-11-22 MED ORDER — OXYCODONE HCL 5 MG PO TABS: 10.0000 mg | ORAL_TABLET | ORAL | Status: DC | PRN

## 2024-11-22 MED ORDER — ACETAMINOPHEN 325 MG PO TABS: 325.0000 mg | ORAL_TABLET | Freq: Four times a day (QID) | ORAL | Status: DC | PRN

## 2024-11-22 MED ORDER — DEXAMETHASONE SOD PHOSPHATE PF 10 MG/ML IJ SOLN
INTRAMUSCULAR | Status: DC | PRN
  Administered 2024-11-22: 10 mg via INTRAVENOUS

## 2024-11-22 MED ORDER — BACITRACIN ZINC 500 UNIT/GM EX OINT
TOPICAL_OINTMENT | CUTANEOUS | Status: AC
  Filled 2024-11-22: qty 28.35

## 2024-11-22 MED ORDER — ONDANSETRON HCL 4 MG/2ML IJ SOLN: 4.0000 mg | Freq: Four times a day (QID) | INTRAMUSCULAR | Status: DC | PRN

## 2024-11-22 MED ORDER — LIDOCAINE HCL (PF) 2 % IJ SOLN
INTRAMUSCULAR | Status: AC
  Filled 2024-11-22: qty 5

## 2024-11-22 MED ORDER — PROPOFOL 10 MG/ML IV BOLUS
INTRAVENOUS | Status: DC | PRN
  Administered 2024-11-22: 130 mg via INTRAVENOUS

## 2024-11-22 MED ORDER — 0.9 % SODIUM CHLORIDE (POUR BTL) OPTIME
TOPICAL | Status: DC | PRN
  Administered 2024-11-22: 1000 mL

## 2024-11-22 MED ORDER — ONDANSETRON HCL 4 MG/2ML IJ SOLN
INTRAMUSCULAR | Status: DC | PRN
  Administered 2024-11-22: 4 mg via INTRAVENOUS

## 2024-11-22 MED ORDER — FAMOTIDINE 20 MG PO TABS: 20.0000 mg | ORAL_TABLET | Freq: Two times a day (BID) | ORAL | Status: DC | PRN

## 2024-11-22 MED ORDER — FENTANYL CITRATE (PF) 100 MCG/2ML IJ SOLN
INTRAMUSCULAR | Status: DC | PRN
  Administered 2024-11-22 (×4): 50 ug via INTRAVENOUS

## 2024-11-22 MED ORDER — MEPERIDINE HCL 25 MG/ML IJ SOLN: 6.2500 mg | INTRAMUSCULAR | Status: DC | PRN

## 2024-11-22 MED ORDER — METHOCARBAMOL 500 MG PO TABS: 500.0000 mg | ORAL_TABLET | Freq: Four times a day (QID) | ORAL | Status: DC | PRN

## 2024-11-22 MED ORDER — ONDANSETRON HCL 4 MG/2ML IJ SOLN: 4.0000 mg | Freq: Once | INTRAMUSCULAR | Status: DC | PRN

## 2024-11-22 MED ORDER — OXYCODONE HCL 5 MG PO TABS: 5.0000 mg | ORAL_TABLET | ORAL | Status: DC | PRN

## 2024-11-22 MED ORDER — ROCURONIUM BROMIDE 10 MG/ML (PF) SYRINGE
PREFILLED_SYRINGE | INTRAVENOUS | Status: AC
  Filled 2024-11-22: qty 10

## 2024-11-22 MED ORDER — BUPIVACAINE LIPOSOME 1.3 % IJ SUSP
INTRAMUSCULAR | Status: DC | PRN
  Administered 2024-11-22: 10 mL via PERINEURAL

## 2024-11-22 MED ORDER — LACTATED RINGERS IV SOLN: INTRAVENOUS | Status: DC | PRN

## 2024-11-22 MED ORDER — PROPOFOL 500 MG/50ML IV EMUL
INTRAVENOUS | Status: DC | PRN
  Administered 2024-11-22: 125 ug/kg/min via INTRAVENOUS

## 2024-11-22 MED ORDER — DOCUSATE SODIUM 100 MG PO CAPS
100.0000 mg | ORAL_CAPSULE | Freq: Two times a day (BID) | ORAL | Status: DC
  Administered 2024-11-22 – 2024-11-23 (×2): 100 mg via ORAL
  Filled 2024-11-22 (×2): qty 1

## 2024-11-22 MED ORDER — HYDROMORPHONE HCL 1 MG/ML IJ SOLN: 0.5000 mg | INTRAMUSCULAR | Status: DC | PRN

## 2024-11-22 MED ORDER — STERILE WATER FOR IRRIGATION IR SOLN
Status: DC | PRN
  Administered 2024-11-22 (×2): 1000 mL

## 2024-11-22 MED ORDER — PROPOFOL 1000 MG/100ML IV EMUL
INTRAVENOUS | Status: AC
  Filled 2024-11-22: qty 100

## 2024-11-22 MED ORDER — ORAL CARE MOUTH RINSE: 15.0000 mL | Freq: Once | OROMUCOSAL | Status: AC

## 2024-11-22 MED ORDER — BUPIVACAINE HCL (PF) 0.5 % IJ SOLN
INTRAMUSCULAR | Status: DC | PRN
  Administered 2024-11-22: 10 mL via PERINEURAL

## 2024-11-22 NOTE — Transfer of Care (Signed)
 Immediate Anesthesia Transfer of Care Note  Patient: Sherry Maxwell  Procedure(s) Performed: ARTHROPLASTY, ELBOW, TOTAL (Left: Elbow) NERVE TUNNEL DECOMPRESSION, UPPER EXTREMITY (Left)  Patient Location: PACU  Anesthesia Type:GA combined with regional for post-op pain  Level of Consciousness: awake, alert , and oriented  Airway & Oxygen Therapy: Patient Spontanous Breathing and Patient connected to face mask oxygen  Post-op Assessment: Report given to RN and Post -op Vital signs reviewed and stable  Post vital signs: Reviewed and stable  Last Vitals:  Vitals Value Taken Time  BP 137/84 11/22/24 11:15  Temp 37.2 C 11/22/24 11:11  Pulse 100 11/22/24 11:17  Resp 25 11/22/24 11:17  SpO2 96 % 11/22/24 11:17  Vitals shown include unfiled device data.  Last Pain:  Vitals:   11/22/24 1111  TempSrc:   PainSc: Asleep         Complications: No notable events documented.

## 2024-11-22 NOTE — Anesthesia Postprocedure Evaluation (Signed)
"   Anesthesia Post Note  Patient: Sherry Maxwell  Procedure(s) Performed: ARTHROPLASTY, ELBOW, TOTAL (Left: Elbow) NERVE TUNNEL DECOMPRESSION, UPPER EXTREMITY (Left)     Patient location during evaluation: PACU Anesthesia Type: General Level of consciousness: awake and alert Pain management: pain level controlled Vital Signs Assessment: post-procedure vital signs reviewed and stable Respiratory status: spontaneous breathing, nonlabored ventilation, respiratory function stable and patient connected to nasal cannula oxygen Cardiovascular status: blood pressure returned to baseline and stable Postop Assessment: no apparent nausea or vomiting Anesthetic complications: no   No notable events documented.  Last Vitals:  Vitals:   11/22/24 1115 11/22/24 1130  BP: 137/84 130/69  Pulse: 91 (!) 105  Resp: 20 (!) 24  Temp:    SpO2: 99% 97%    Last Pain:  Vitals:   11/22/24 1130  TempSrc:   PainSc: 0-No pain                 [name]      "

## 2024-11-22 NOTE — H&P (Signed)
 Sherry Maxwell is an 59 y.o. female.   Chief Complaint: Patient presents for left TEA HPI: Patient presents for evaluation and treatment of the of their upper extremity predicament. The patient denies neck, back, chest or  abdominal pain. The patient notes that they have no lower extremity problems. The patients primary complaint is noted. We are planning surgical care pathway for the upper extremity.   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   Colitis    Depression    Gastritis    History of kidney stones 2022   Hypertension    Parathyroid adenoma    rheumatoid arthritis    Rheumatoid arthritis (HCC)    Urinary tract infection     Past Surgical History:  Procedure Laterality Date   COLONOSCOPY  05/20/2014   DILATION AND CURETTAGE OF UTERUS     FOOT SURGERY     1996 and 2011   KIDNEY SURGERY  10/20/1969   Born with a defect   UPPER GI ENDOSCOPY  05/20/2014    Family History  Problem Relation Age of Onset   Arthritis Mother    Hypertension Mother    Thyroid disease Mother        Graves Disease   Breast cancer Mother 47       breast   Hypertension Father    Diabetes Father        pancreatitis induced   CAD Father    Thyroid disease Sister        Graves Disease   Breast cancer Sister 83   Thyroid disease Brother        Graves Disease   Hypertension Maternal Grandmother    Arthritis Paternal Grandmother    Social History:  reports that she has never smoked. She has never used smokeless tobacco. She reports that she does not currently use alcohol. She reports that she does not use drugs.  Allergies: Allergies[1]  Medications Prior to Admission  Medication Sig Dispense Refill   b complex vitamins capsule Take 1 capsule by mouth daily.     hydroxychloroquine (PLAQUENIL) 200 MG tablet Take 200-400 mg by mouth See admin instructions. 200 mg Mon Wed Fri,  400 mg Sun Tu Th Sat     Magnesium Glycinate 120 MG CAPS Take 120 mg by mouth at bedtime.     Milk Thistle 250 MG CAPS Take 250 mg by mouth at bedtime.     Moringa 500 MG CAPS Take 500 mg by mouth daily.     Probiotic Product (PROBIOTIC DAILY PO) Take 1 capsule by mouth at bedtime.     TURMERIC-FISH OIL PO Take 1 capsule by mouth daily. Turmeric + Omega 3-6-9      No results found for this or any previous visit (from the past 48 hours). No results found.  Review of Systems  Blood pressure (!) 148/89, pulse 89, temperature 98.7 F (37.1 C), temperature source Oral, resp. rate 16, height 5' 1 (1.549 m), weight 55.3 kg, last menstrual period 12/11/2012, SpO2 99%. Physical Exam LUE with loss of motion and chronic pain and deformity NVI The patient is alert and oriented in no acute distress. The patient complains of pain in the affected upper extremity.  The patient is noted to have a normal HEENT exam. Lung fields show equal chest expansion and no shortness of breath. Abdomen exam is nontender  without distention. Lower extremity examination does not show any fracture dislocation or blood clot symptoms. Pelvis is stable and the neck and back are stable and nontender.   Assessment/Plan Plan for left TEA We are planning surgery for your upper extremity. The risk and benefits of surgery to include risk of bleeding, infection, anesthesia,  damage to normal structures and failure of the surgery to accomplish its intended goals of relieving symptoms and restoring function have been discussed in detail. With this in mind we plan to proceed. I have specifically discussed with the patient the pre-and postoperative regime and the dos and don'ts and risk and benefits in great detail. Risk and benefits of surgery also include risk of dystrophy(CRPS), chronic nerve pain, failure of the healing process to go onto completion and other inherent risks of surgery The relavent the pathophysiology of the  disease/injury process, as well as the alternatives for treatment and postoperative course of action has been discussed in great detail with the patient who desires to proceed.  We will do everything in our power to help you (the patient) restore function to the upper extremity. It is a pleasure to see this patient today.   Elsie CHRISTELLA Mussel III, MD 11/22/2024, 6:22 AM       [1]  Allergies Allergen Reactions   Sulfa Antibiotics Swelling and Rash

## 2024-11-23 NOTE — Discharge Instructions (Signed)
 We recommend that you to take vitamin C 1000 mg a day to promote healing. We also recommend that if you require  pain medicine that you take a stool softener to prevent constipation as most pain medicines will have constipation side effects. We recommend either Peri-Colace or Senokot and recommend that you also consider adding MiraLAX as well to prevent the constipation affects from pain medicine if you are required to use them. These medicines are over the counter and may be purchased at a local pharmacy. A cup of yogurt and a probiotic can also be helpful during the recovery process as the medicines can disrupt your intestinal environment. Keep bandage clean and dry.  Call for any problems.  No smoking.  Criteria for driving a car: you should be off your pain medicine for 7-8 hours, able to drive one handed(confident), thinking clearly and feeling able in your judgement to drive. Continue elevation as it will decrease swelling.  If instructed by MD move your fingers within the confines of the bandage/splint.  Use ice if instructed by your MD. Call immediately for any sudden loss of feeling in your hand/arm or change in functional abilities of the extremity.

## 2024-11-23 NOTE — Discharge Summary (Signed)
 Physician Discharge Summary  Patient ID: Sherry Maxwell MRN: [id number] DOB/AGE: 1966/03/26 59 y.o.  Admit date: 11/22/2024 Discharge date: 11/23/2024  Admission Diagnoses: Rheumatoid arthritis of left elbow Past Medical History:  Diagnosis Date   Anemia    Chronic kidney disease    congenital abnormality, had surgery Age 17  only has 1 1/2 kidney functions   Colitis    Depression    Gastritis    History of kidney stones 2022   Hypertension    Parathyroid adenoma    rheumatoid arthritis    Rheumatoid arthritis (HCC)    Urinary tract infection     Discharge Diagnoses:  Principal Problem:   Arthritis of left elbow   Surgeries: Procedures: ARTHROPLASTY, ELBOW, TOTAL NERVE TUNNEL DECOMPRESSION, UPPER EXTREMITY on 11/22/2024    Consultants:   Discharged Condition: Improved  Hospital Course: Sherry Maxwell is an 59 y.o. female who was admitted 11/22/2024 with a chief complaint of No chief complaint on file. , and found to have a diagnosis of Rheumatoid arthritis of left elbow.  They were brought to the operating room on 11/22/2024 and underwent Procedures: ARTHROPLASTY, ELBOW, TOTAL NERVE TUNNEL DECOMPRESSION, UPPER EXTREMITY.    They were given perioperative antibiotics:  Anti-infectives (From admission, onward)    Start     Dose/Rate Route Frequency Ordered Stop   11/22/24 2200  ceFAZolin  (ANCEF ) IVPB 1 g/50 mL premix        1 g 100 mL/hr over 30 Minutes Intravenous Every 8 hours 11/22/24 1527 11/29/24 2159   11/22/24 1615  ceFAZolin  (ANCEF ) IVPB 1 g/50 mL premix        1 g 100 mL/hr over 30 Minutes Intravenous NOW 11/22/24 1527 11/22/24 1718   11/22/24 0715  ceFAZolin  (ANCEF ) IVPB 2g/100 mL premix        2 g 200 mL/hr over 30 Minutes Intravenous On call to O.R. 11/22/24 [id number] 11/22/24 0747     .  They were given sequential compression devices, early ambulation, and Other (comment) for DVT prophylaxis.  Recent vital signs: Patient Vitals for the past 24 hrs:  BP Temp  Temp src Pulse Resp SpO2  11/23/24 0551 131/84 97.7 F (36.5 C) -- 76 16 98 %  11/23/24 0105 122/80 98 F (36.7 C) -- 77 16 97 %  11/22/24 2043 121/74 97.8 F (36.6 C) -- 90 15 96 %  11/22/24 1817 134/79 97.8 F (36.6 C) -- 95 16 97 %  11/22/24 1653 127/82 (!) 97.4 F (36.3 C) -- 99 16 98 %  11/22/24 1617 128/87 98 F (36.7 C) -- 91 16 98 %  11/22/24 1449 (!) 133/90 98 F (36.7 C) Oral 94 16 96 %  11/22/24 1400 126/78 -- -- 95 -- 96 %  11/22/24 1300 125/85 -- -- 97 -- 97 %  11/22/24 1200 129/80 -- -- 97 19 97 %  11/22/24 1145 129/83 -- -- 98 19 97 %  11/22/24 1130 130/69 -- -- (!) 105 (!) 24 97 %  11/22/24 1115 137/84 -- -- 91 20 99 %  11/22/24 1111 (!) 145/91 99 F (37.2 C) -- 91 (!) 21 98 %  .  Recent laboratory studies: No results found.  Discharge Medications:   Allergies as of 11/23/2024       Reactions   Sulfa Antibiotics Swelling, Rash        Medication List     TAKE these medications    b complex vitamins capsule Take 1 capsule by mouth daily.  hydroxychloroquine 200 MG tablet Commonly known as: PLAQUENIL Take 200-400 mg by mouth See admin instructions. 200 mg Mon Wed Fri, 400 mg Sun Tu Th Sat   Magnesium Glycinate 120 MG Caps Take 120 mg by mouth at bedtime.   Milk Thistle 250 MG Caps Take 250 mg by mouth at bedtime.   Moringa 500 MG Caps Take 500 mg by mouth daily.   PROBIOTIC DAILY PO Take 1 capsule by mouth at bedtime.   TURMERIC-FISH OIL PO Take 1 capsule by mouth daily. Turmeric + Omega 3-6-9        Diagnostic Studies: No results found.  They benefited maximally from their hospital stay and there were no complications.     Disposition: Discharge disposition: 01-Home or Self Care      Discharge Instructions     Call MD / Call 911   Complete by: As directed    If you experience chest pain or shortness of breath, CALL 911 and be transported to the hospital emergency room.  If you develope a fever above 101 F, pus (white  drainage) or increased drainage or redness at the wound, or calf pain, call your surgeon's office.   Call MD / Call 911   Complete by: As directed    If you experience chest pain or shortness of breath, CALL 911 and be transported to the hospital emergency room.  If you develope a fever above 101 F, pus (white drainage) or increased drainage or redness at the wound, or calf pain, call your surgeon's office.   Constipation Prevention   Complete by: As directed    Drink plenty of fluids.  Prune juice may be helpful.  You may use a stool softener, such as Colace (over the counter) 100 mg twice a day.  Use MiraLax (over the counter) for constipation as needed.   Constipation Prevention   Complete by: As directed    Drink plenty of fluids.  Prune juice may be helpful.  You may use a stool softener, such as Colace (over the counter) 100 mg twice a day.  Use MiraLax (over the counter) for constipation as needed.   Increase activity slowly as tolerated   Complete by: As directed    Increase activity slowly as tolerated   Complete by: As directed    Post-operative opioid taper instructions:   Complete by: As directed    POST-OPERATIVE OPIOID TAPER INSTRUCTIONS: It is important to wean off of your opioid medication as soon as possible. If you do not need pain medication after your surgery it is ok to stop day one. Opioids include: Codeine, Hydrocodone(Norco, Vicodin), Oxycodone (Percocet, oxycontin ) and hydromorphone  amongst others.  Long term and even short term use of opiods can cause: Increased pain response Dependence Constipation Depression Respiratory depression And more.  Withdrawal symptoms can include Flu like symptoms Nausea, vomiting And more Techniques to manage these symptoms Hydrate well Eat regular healthy meals Stay active Use relaxation techniques(deep breathing, meditating, yoga) Do Not substitute Alcohol to help with tapering If you have been on opioids for less than two  weeks and do not have pain than it is ok to stop all together.  Plan to wean off of opioids This plan should start within one week post op of your joint replacement. Maintain the same interval or time between taking each dose and first decrease the dose.  Cut the total daily intake of opioids by one tablet each day Next start to increase the time between doses. The last dose  that should be eliminated is the evening dose.      Post-operative opioid taper instructions:   Complete by: As directed    POST-OPERATIVE OPIOID TAPER INSTRUCTIONS: It is important to wean off of your opioid medication as soon as possible. If you do not need pain medication after your surgery it is ok to stop day one. Opioids include: Codeine, Hydrocodone(Norco, Vicodin), Oxycodone (Percocet, oxycontin ) and hydromorphone  amongst others.  Long term and even short term use of opiods can cause: Increased pain response Dependence Constipation Depression Respiratory depression And more.  Withdrawal symptoms can include Flu like symptoms Nausea, vomiting And more Techniques to manage these symptoms Hydrate well Eat regular healthy meals Stay active Use relaxation techniques(deep breathing, meditating, yoga) Do Not substitute Alcohol to help with tapering If you have been on opioids for less than two weeks and do not have pain than it is ok to stop all together.  Plan to wean off of opioids This plan should start within one week post op of your joint replacement. Maintain the same interval or time between taking each dose and first decrease the dose.  Cut the total daily intake of opioids by one tablet each day Next start to increase the time between doses. The last dose that should be eliminated is the evening dose.          Follow-up Information     Camella Fallow, MD Follow up in 14 day(s).   Specialty: Orthopedic Surgery Why: We will call for your follow-up to be seen in 14 days Contact  information: [address] [place] [address] [phone]                Status post left total elbow arthroplasty with ulnar nerve transposition and repair reconstruction-November 22, 2024.  Date of discharge November 23, 2024.  No complications.  No signs of UTI, DVT or other problems.  Patient is awake alert oriented and tolerating diet well.  There were no complications.  All instructions have been given.  All medicines have been provided through my office electronic medical records.  Signed: Fallow HERO Candies Palm III 11/23/2024, 7:00 AM

## 2024-11-23 NOTE — Progress Notes (Addendum)
 Occupational Therapy Evaluation/Discharge Patient Details Name: Sherry Maxwell MRN: [id number] DOB: 23-Mar-1966 Today's Date: 11/23/2024   History of Present Illness   59 y/o female admitted on 11/22/24 with diagnosis of RA of left elbow. Underwent left elbow total arthroplasty and nerve tunnel decompression. PMH: anemia, depression, HTN, RA, UTI.     Clinical Impressions s/p elbow replacement without functional use of LUE secondary to effects of surgery and interscalene block and elbow precautions. Therapist provided education and instruction to patient and spouse in regards to exercises, precautions, positioning, donning upper extremity clothing and bathing while maintaining elbow precautions, ice and edema management, use of ice and donning/doffing sling (for comfort). Patient and spouse verbalized understanding and demonstrated as needed. Patient needed min assistance to donn shirt, underwear, pants, socks and shoes and provided with instruction on compensatory strategies to perform ADLs. Patient to follow up with MD for further therapy needs.        If plan is discharge home, recommend the following:   Assistance with cooking/housework;A little help with bathing/dressing/bathroom     Functional Status Assessment   Patient has had a recent decline in their functional status and demonstrates the ability to make significant improvements in function in a reasonable and predictable amount of time.     Equipment Recommendations   None recommended by OT      Precautions/Restrictions   Precautions Precautions: Other (comment) Precaution/Restrictions Comments: post op elbow precautions Required Braces or Orthoses: Sling (as needed for comfort/support of LUE) Restrictions Weight Bearing Restrictions Per Provider Order: Yes LUE Weight Bearing Per Provider Order: Non weight bearing     Mobility Bed Mobility Overal bed mobility: Independent   Transfers Overall transfer  level: Independent Equipment used: None       Balance Overall balance assessment: Independent       ADL either performed or assessed with clinical judgement   ADL        General ADL Comments: Provided Min A for UB and LB dressing. Family able to provide assist. Pt educated on use of sling to support UE as needed for comfort d/t to weight of surgical cast. Education provided for sling donning/doffing technique and position of UE in sling.     Vision Baseline Vision/History: 0 No visual deficits Ability to See in Adequate Light: 0 Adequate Patient Visual Report: No change from baseline       Perception Perception: Within Functional Limits       Praxis Praxis: WFL       Pertinent Vitals/Pain Pain Assessment Pain Assessment: No/denies pain (nerve block still in effect)     Extremity/Trunk Assessment Upper Extremity Assessment Upper Extremity Assessment: Right hand dominant;LUE deficits/detail LUE Deficits / Details: recent elbow surgery. In surgical cast from hand to upper arm. Able to move fingers some. Pt educated on pain management and edema management techniques including use of pillows for positioning, ice pack, and finger ROM. LUE: Unable to fully assess due to immobilization LUE Coordination: decreased fine motor;decreased gross motor   Lower Extremity Assessment Lower Extremity Assessment: Overall WFL for tasks assessed   Cervical / Trunk Assessment Cervical / Trunk Assessment: Normal   Communication Communication Communication: No apparent difficulties   Cognition Arousal: Alert Behavior During Therapy: WFL for tasks assessed/performed Cognition: No apparent impairments              Following commands: Intact       Cueing  General Comments   Cueing Techniques: Verbal cues  noted recent bleeding through surgical  cast on medial posterior region. Notified nursing.           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    OT Problem List: Decreased strength;Impaired UE functional use;Decreased range of motion;Pain;Decreased coordination        OT Goals(Current goals can be found in the care plan section)   Acute Rehab OT Goals Patient Stated Goal: to go home today OT Goal Formulation: All assessment and education complete, DC therapy   OT Frequency:   1X visit       AM-PAC OT 6 Clicks Daily Activity     Outcome Measure Help from another person eating meals?: A Little Help from another person taking care of personal grooming?: A Little Help from another person toileting, which includes using toliet, bedpan, or urinal?: A Little Help from another person bathing (including washing, rinsing, drying)?: A Little Help from another person to put on and taking off regular upper body clothing?: A Little Help from another person to put on and taking off regular lower body clothing?: A Little 6 Click Score: 18   End of Session Equipment Utilized During Treatment: Other (comment) (sling) Nurse Communication: Other (comment) (bleeding on surgical cast)  Activity Tolerance: Patient tolerated treatment well Patient left: in chair;with call bell/phone within reach;with family/visitor present  OT Visit Diagnosis: Muscle weakness (generalized) (M62.81);Pain Pain - Right/Left: Left Pain - part of body: Arm                Time: 9184-9147 OT Time Calculation (min): 37 min Charges:  OT General Charges $OT Visit: 1 Visit OT Evaluation $OT Eval Moderate Complexity: 1 Mod OT Treatments $Self Care/Home Management : 8-22 mins  Leita Howell, OTR/L,CBIS  Supplemental OT - [facility] and [facility] Secure Chat Preferred     Kamaryn Grimley, Leita BIRCH 11/23/2024, 9:05 AM

## 2024-11-23 NOTE — Progress Notes (Signed)
 PT Cancellation Note  Patient Details Name: LANAYSIA FRITCHMAN MRN: [id number] DOB: [birth year]   Cancelled Treatment:    Reason Eval/Treat Not Completed: PT screened, no needs identified, will sign off Darice Potters PT Acute Rehabilitation Services Office [phone]   Potters Darice Norris 11/23/2024, 10:06 AM

## 2024-11-23 NOTE — TOC Transition Note (Signed)
 Transition of Care [facility]) - Discharge Note   Patient Details  Name: Sherry Maxwell MRN: [id number] Date of Birth: 05-27-1966  Transition of Care [facility]) CM/SW Contact:  Alfonse JONELLE Rex, RN Phone Number: 11/23/2024, 11:00 AM   Clinical Narrative:   Admitted for scheduled arthroplasty of left elbow. No CM needs           Patient Goals and CMS Choice            Discharge Placement                       Discharge Plan and Services Additional resources added to the After Visit Summary for                                       Social Drivers of Health (SDOH) Interventions SDOH Screenings   Food Insecurity: No Food Insecurity (11/22/2024)  Housing: Low Risk (11/22/2024)  Transportation Needs: No Transportation Needs (11/22/2024)  Utilities: Not At Risk (11/22/2024)  Tobacco Use: Low Risk (11/22/2024)     Readmission Risk Interventions     No data to display

## 2024-11-24 ENCOUNTER — Encounter (HOSPITAL_COMMUNITY): Payer: Self-pay | Admitting: Orthopedic Surgery
